# Patient Record
Sex: Female | Born: 1935 | Race: White | Hispanic: No | Smoking: Never smoker
Health system: Southern US, Community
[De-identification: ages and names within clinical notes are randomized; demographics above are authoritative.]

## PROBLEM LIST (undated history)

## (undated) DIAGNOSIS — I10 Essential (primary) hypertension: Secondary | ICD-10-CM

## (undated) DIAGNOSIS — K219 Gastro-esophageal reflux disease without esophagitis: Secondary | ICD-10-CM

## (undated) DIAGNOSIS — F329 Major depressive disorder, single episode, unspecified: Secondary | ICD-10-CM

## (undated) DIAGNOSIS — E785 Hyperlipidemia, unspecified: Secondary | ICD-10-CM

## (undated) DIAGNOSIS — G4700 Insomnia, unspecified: Secondary | ICD-10-CM

## (undated) DIAGNOSIS — K589 Irritable bowel syndrome without diarrhea: Secondary | ICD-10-CM

## (undated) DIAGNOSIS — F32A Depression, unspecified: Secondary | ICD-10-CM

## (undated) HISTORY — PX: TOE AMPUTATION: SHX809

---

## 2018-01-08 ENCOUNTER — Inpatient Hospital Stay (HOSPITAL_COMMUNITY): Payer: Medicare Other

## 2018-01-08 ENCOUNTER — Encounter (HOSPITAL_COMMUNITY)
Admission: EM | Disposition: A | Discharge status: Skilled Nursing Facility | Payer: Self-pay | Source: Home / Self Care | Attending: Internal Medicine

## 2018-01-08 ENCOUNTER — Encounter (HOSPITAL_COMMUNITY): Payer: Self-pay | Admitting: Internal Medicine

## 2018-01-08 ENCOUNTER — Inpatient Hospital Stay (HOSPITAL_COMMUNITY)
Admission: EM | Admit: 2018-01-08 | Discharge: 2018-01-11 | DRG: 481 | Disposition: A | Discharge status: Skilled Nursing Facility | Payer: Medicare Other | Attending: Family Medicine | Admitting: Family Medicine

## 2018-01-08 ENCOUNTER — Other Ambulatory Visit: Payer: Self-pay

## 2018-01-08 ENCOUNTER — Emergency Department (HOSPITAL_COMMUNITY): Payer: Medicare Other

## 2018-01-08 ENCOUNTER — Inpatient Hospital Stay (HOSPITAL_COMMUNITY): Payer: Medicare Other | Admitting: Anesthesiology

## 2018-01-08 DIAGNOSIS — J9811 Atelectasis: Secondary | ICD-10-CM | POA: Diagnosis present

## 2018-01-08 DIAGNOSIS — F05 Delirium due to known physiological condition: Secondary | ICD-10-CM | POA: Diagnosis not present

## 2018-01-08 DIAGNOSIS — K219 Gastro-esophageal reflux disease without esophagitis: Secondary | ICD-10-CM | POA: Diagnosis present

## 2018-01-08 DIAGNOSIS — Z880 Allergy status to penicillin: Secondary | ICD-10-CM | POA: Diagnosis not present

## 2018-01-08 DIAGNOSIS — G479 Sleep disorder, unspecified: Secondary | ICD-10-CM

## 2018-01-08 DIAGNOSIS — F5101 Primary insomnia: Secondary | ICD-10-CM

## 2018-01-08 DIAGNOSIS — S72001A Fracture of unspecified part of neck of right femur, initial encounter for closed fracture: Secondary | ICD-10-CM | POA: Diagnosis not present

## 2018-01-08 DIAGNOSIS — K582 Mixed irritable bowel syndrome: Secondary | ICD-10-CM | POA: Diagnosis not present

## 2018-01-08 DIAGNOSIS — K581 Irritable bowel syndrome with constipation: Secondary | ICD-10-CM | POA: Diagnosis present

## 2018-01-08 DIAGNOSIS — R410 Disorientation, unspecified: Secondary | ICD-10-CM | POA: Diagnosis not present

## 2018-01-08 DIAGNOSIS — S72141A Displaced intertrochanteric fracture of right femur, initial encounter for closed fracture: Principal | ICD-10-CM | POA: Diagnosis present

## 2018-01-08 DIAGNOSIS — N184 Chronic kidney disease, stage 4 (severe): Secondary | ICD-10-CM | POA: Diagnosis present

## 2018-01-08 DIAGNOSIS — E860 Dehydration: Secondary | ICD-10-CM

## 2018-01-08 DIAGNOSIS — D72829 Elevated white blood cell count, unspecified: Secondary | ICD-10-CM | POA: Diagnosis not present

## 2018-01-08 DIAGNOSIS — R300 Dysuria: Secondary | ICD-10-CM | POA: Diagnosis not present

## 2018-01-08 DIAGNOSIS — Z87440 Personal history of urinary (tract) infections: Secondary | ICD-10-CM

## 2018-01-08 DIAGNOSIS — Z66 Do not resuscitate: Secondary | ICD-10-CM | POA: Diagnosis present

## 2018-01-08 DIAGNOSIS — I10 Essential (primary) hypertension: Secondary | ICD-10-CM | POA: Diagnosis not present

## 2018-01-08 DIAGNOSIS — K589 Irritable bowel syndrome without diarrhea: Secondary | ICD-10-CM | POA: Diagnosis present

## 2018-01-08 DIAGNOSIS — E785 Hyperlipidemia, unspecified: Secondary | ICD-10-CM | POA: Diagnosis present

## 2018-01-08 DIAGNOSIS — G4700 Insomnia, unspecified: Secondary | ICD-10-CM | POA: Diagnosis present

## 2018-01-08 DIAGNOSIS — Z79899 Other long term (current) drug therapy: Secondary | ICD-10-CM

## 2018-01-08 DIAGNOSIS — S72009A Fracture of unspecified part of neck of unspecified femur, initial encounter for closed fracture: Secondary | ICD-10-CM | POA: Diagnosis present

## 2018-01-08 DIAGNOSIS — F32A Depression, unspecified: Secondary | ICD-10-CM | POA: Diagnosis present

## 2018-01-08 DIAGNOSIS — Y92099 Unspecified place in other non-institutional residence as the place of occurrence of the external cause: Secondary | ICD-10-CM

## 2018-01-08 DIAGNOSIS — Z882 Allergy status to sulfonamides status: Secondary | ICD-10-CM | POA: Diagnosis not present

## 2018-01-08 DIAGNOSIS — I129 Hypertensive chronic kidney disease with stage 1 through stage 4 chronic kidney disease, or unspecified chronic kidney disease: Secondary | ICD-10-CM | POA: Diagnosis present

## 2018-01-08 DIAGNOSIS — Z825 Family history of asthma and other chronic lower respiratory diseases: Secondary | ICD-10-CM | POA: Diagnosis not present

## 2018-01-08 DIAGNOSIS — F329 Major depressive disorder, single episode, unspecified: Secondary | ICD-10-CM | POA: Diagnosis present

## 2018-01-08 DIAGNOSIS — Z833 Family history of diabetes mellitus: Secondary | ICD-10-CM

## 2018-01-08 DIAGNOSIS — L89611 Pressure ulcer of right heel, stage 1: Secondary | ICD-10-CM | POA: Diagnosis not present

## 2018-01-08 DIAGNOSIS — M25551 Pain in right hip: Secondary | ICD-10-CM | POA: Diagnosis present

## 2018-01-08 DIAGNOSIS — D62 Acute posthemorrhagic anemia: Secondary | ICD-10-CM | POA: Diagnosis not present

## 2018-01-08 DIAGNOSIS — N179 Acute kidney failure, unspecified: Secondary | ICD-10-CM | POA: Diagnosis present

## 2018-01-08 DIAGNOSIS — Z419 Encounter for procedure for purposes other than remedying health state, unspecified: Secondary | ICD-10-CM

## 2018-01-08 DIAGNOSIS — G8918 Other acute postprocedural pain: Secondary | ICD-10-CM | POA: Diagnosis not present

## 2018-01-08 DIAGNOSIS — Z89429 Acquired absence of other toe(s), unspecified side: Secondary | ICD-10-CM | POA: Diagnosis not present

## 2018-01-08 DIAGNOSIS — I9589 Other hypotension: Secondary | ICD-10-CM | POA: Diagnosis present

## 2018-01-08 DIAGNOSIS — I959 Hypotension, unspecified: Secondary | ICD-10-CM | POA: Diagnosis not present

## 2018-01-08 DIAGNOSIS — W1839XA Other fall on same level, initial encounter: Secondary | ICD-10-CM | POA: Diagnosis present

## 2018-01-08 HISTORY — DX: Insomnia, unspecified: G47.00

## 2018-01-08 HISTORY — PX: INTRAMEDULLARY (IM) NAIL INTERTROCHANTERIC: SHX5875

## 2018-01-08 HISTORY — DX: Major depressive disorder, single episode, unspecified: F32.9

## 2018-01-08 HISTORY — DX: Essential (primary) hypertension: I10

## 2018-01-08 HISTORY — DX: Irritable bowel syndrome without diarrhea: K58.9

## 2018-01-08 HISTORY — DX: Gastro-esophageal reflux disease without esophagitis: K21.9

## 2018-01-08 HISTORY — DX: Depression, unspecified: F32.A

## 2018-01-08 HISTORY — DX: Hyperlipidemia, unspecified: E78.5

## 2018-01-08 LAB — CBC WITH DIFFERENTIAL/PLATELET
Abs Immature Granulocytes: 0.05 10*3/uL (ref 0.00–0.07)
BASOS ABS: 0.1 10*3/uL (ref 0.0–0.1)
Basophils Relative: 0 %
EOS ABS: 0.1 10*3/uL (ref 0.0–0.5)
Eosinophils Relative: 1 %
HCT: 40.8 % (ref 36.0–46.0)
Hemoglobin: 12.9 g/dL (ref 12.0–15.0)
Immature Granulocytes: 0 %
Lymphocytes Relative: 19 %
Lymphs Abs: 2.4 10*3/uL (ref 0.7–4.0)
MCH: 30.4 pg (ref 26.0–34.0)
MCHC: 31.6 g/dL (ref 30.0–36.0)
MCV: 96 fL (ref 80.0–100.0)
Monocytes Absolute: 0.6 10*3/uL (ref 0.1–1.0)
Monocytes Relative: 5 %
NRBC: 0 % (ref 0.0–0.2)
Neutro Abs: 9.2 10*3/uL — ABNORMAL HIGH (ref 1.7–7.7)
Neutrophils Relative %: 75 %
Platelets: 242 10*3/uL (ref 150–400)
RBC: 4.25 MIL/uL (ref 3.87–5.11)
RDW: 13.2 % (ref 11.5–15.5)
WBC: 12.3 10*3/uL — AB (ref 4.0–10.5)

## 2018-01-08 LAB — BASIC METABOLIC PANEL
Anion gap: 9 (ref 5–15)
BUN: 27 mg/dL — ABNORMAL HIGH (ref 8–23)
CO2: 18 mmol/L — ABNORMAL LOW (ref 22–32)
Calcium: 9.2 mg/dL (ref 8.9–10.3)
Chloride: 111 mmol/L (ref 98–111)
Creatinine, Ser: 1.63 mg/dL — ABNORMAL HIGH (ref 0.44–1.00)
GFR calc Af Amer: 31 mL/min — ABNORMAL LOW (ref >60)
GFR calc non Af Amer: 27 mL/min — ABNORMAL LOW (ref >60)
Glucose, Bld: 164 mg/dL — ABNORMAL HIGH (ref 70–99)
Potassium: 4.2 mmol/L (ref 3.5–5.1)
SODIUM: 138 mmol/L (ref 135–145)

## 2018-01-08 LAB — ABO/RH: ABO/RH(D): A POS

## 2018-01-08 LAB — PROTIME-INR
INR: 0.99
Prothrombin Time: 13 seconds (ref 11.4–15.2)

## 2018-01-08 SURGERY — FIXATION, FRACTURE, INTERTROCHANTERIC, WITH INTRAMEDULLARY ROD
Anesthesia: General | Site: Hip | Laterality: Right

## 2018-01-08 MED ORDER — CLINDAMYCIN PHOSPHATE 900 MG/50ML IV SOLN
INTRAVENOUS | Status: AC
  Filled 2018-01-08: qty 50

## 2018-01-08 MED ORDER — ACETAMINOPHEN 325 MG PO TABS
650.0000 mg | ORAL_TABLET | Freq: Four times a day (QID) | ORAL | Status: DC | PRN
  Administered 2018-01-10 (×2): 325 mg via ORAL
  Filled 2018-01-08: qty 2

## 2018-01-08 MED ORDER — FAMOTIDINE 20 MG PO TABS
20.0000 mg | ORAL_TABLET | Freq: Every day | ORAL | Status: DC
  Administered 2018-01-08 – 2018-01-10 (×3): 20 mg via ORAL
  Filled 2018-01-08 (×3): qty 1

## 2018-01-08 MED ORDER — LIDOCAINE 2% (20 MG/ML) 5 ML SYRINGE
INTRAMUSCULAR | Status: AC
  Filled 2018-01-08: qty 5

## 2018-01-08 MED ORDER — PROPOFOL 10 MG/ML IV BOLUS
INTRAVENOUS | Status: AC
  Filled 2018-01-08: qty 20

## 2018-01-08 MED ORDER — EPHEDRINE SULFATE 50 MG/ML IJ SOLN
INTRAMUSCULAR | Status: DC | PRN
  Administered 2018-01-08 (×5): 5 mg via INTRAVENOUS

## 2018-01-08 MED ORDER — OLANZAPINE 5 MG PO TABS
2.5000 mg | ORAL_TABLET | Freq: Every day | ORAL | Status: DC
  Administered 2018-01-08 – 2018-01-10 (×3): 2.5 mg via ORAL
  Filled 2018-01-08 (×3): qty 1

## 2018-01-08 MED ORDER — DIAZEPAM 2 MG PO TABS
4.0000 mg | ORAL_TABLET | Freq: Every evening | ORAL | Status: DC | PRN
  Administered 2018-01-08: 4 mg via ORAL
  Filled 2018-01-08: qty 2

## 2018-01-08 MED ORDER — FENTANYL CITRATE (PF) 100 MCG/2ML IJ SOLN
50.0000 ug | INTRAMUSCULAR | Status: DC | PRN
  Administered 2018-01-08 (×2): 50 ug via INTRAVENOUS
  Filled 2018-01-08: qty 2

## 2018-01-08 MED ORDER — PROPOFOL 10 MG/ML IV BOLUS
INTRAVENOUS | Status: DC | PRN
  Administered 2018-01-08: 70 mg via INTRAVENOUS

## 2018-01-08 MED ORDER — ONDANSETRON HCL 4 MG/2ML IJ SOLN
INTRAMUSCULAR | Status: AC
  Filled 2018-01-08: qty 2

## 2018-01-08 MED ORDER — ROCURONIUM BROMIDE 50 MG/5ML IV SOSY
PREFILLED_SYRINGE | INTRAVENOUS | Status: DC | PRN
  Administered 2018-01-08: 50 mg via INTRAVENOUS

## 2018-01-08 MED ORDER — CLINDAMYCIN PHOSPHATE 600 MG/50ML IV SOLN
600.0000 mg | Freq: Four times a day (QID) | INTRAVENOUS | Status: AC
  Administered 2018-01-08 – 2018-01-09 (×3): 600 mg via INTRAVENOUS
  Filled 2018-01-08 (×3): qty 50

## 2018-01-08 MED ORDER — ACETAMINOPHEN 10 MG/ML IV SOLN
INTRAVENOUS | Status: AC
  Filled 2018-01-08: qty 100

## 2018-01-08 MED ORDER — LACTATED RINGERS IV SOLN
INTRAVENOUS | Status: DC
  Administered 2018-01-08: 16:00:00 via INTRAVENOUS

## 2018-01-08 MED ORDER — DOCUSATE SODIUM 100 MG PO CAPS
100.0000 mg | ORAL_CAPSULE | Freq: Two times a day (BID) | ORAL | Status: DC
  Administered 2018-01-08 – 2018-01-10 (×5): 100 mg via ORAL
  Filled 2018-01-08 (×6): qty 1

## 2018-01-08 MED ORDER — ACETAMINOPHEN 325 MG PO TABS: 325.0000 mg | ORAL_TABLET | Freq: Four times a day (QID) | ORAL | Status: DC | PRN

## 2018-01-08 MED ORDER — ONDANSETRON HCL 4 MG/2ML IJ SOLN
INTRAMUSCULAR | Status: DC | PRN
  Administered 2018-01-08: 4 mg via INTRAVENOUS

## 2018-01-08 MED ORDER — DEXAMETHASONE SODIUM PHOSPHATE 10 MG/ML IJ SOLN
INTRAMUSCULAR | Status: DC | PRN
  Administered 2018-01-08: 10 mg via INTRAVENOUS

## 2018-01-08 MED ORDER — ACETAMINOPHEN 10 MG/ML IV SOLN
INTRAVENOUS | Status: DC | PRN
  Administered 2018-01-08: 1000 mg via INTRAVENOUS

## 2018-01-08 MED ORDER — ROCURONIUM BROMIDE 50 MG/5ML IV SOSY
PREFILLED_SYRINGE | INTRAVENOUS | Status: AC
  Filled 2018-01-08: qty 5

## 2018-01-08 MED ORDER — METOCLOPRAMIDE HCL 5 MG/ML IJ SOLN: 5.0000 mg | Freq: Three times a day (TID) | INTRAMUSCULAR | Status: DC | PRN

## 2018-01-08 MED ORDER — FENTANYL CITRATE (PF) 250 MCG/5ML IJ SOLN
INTRAMUSCULAR | Status: DC | PRN
  Administered 2018-01-08: 25 ug via INTRAVENOUS
  Administered 2018-01-08: 50 ug via INTRAVENOUS

## 2018-01-08 MED ORDER — METOCLOPRAMIDE HCL 5 MG PO TABS: 5.0000 mg | ORAL_TABLET | Freq: Three times a day (TID) | ORAL | Status: DC | PRN

## 2018-01-08 MED ORDER — ACETAMINOPHEN 650 MG RE SUPP: 650.0000 mg | Freq: Four times a day (QID) | RECTAL | Status: DC | PRN

## 2018-01-08 MED ORDER — 0.9 % SODIUM CHLORIDE (POUR BTL) OPTIME
TOPICAL | Status: DC | PRN
  Administered 2018-01-08: 1000 mL

## 2018-01-08 MED ORDER — SENNOSIDES-DOCUSATE SODIUM 8.6-50 MG PO TABS: 1.0000 | ORAL_TABLET | Freq: Every evening | ORAL | Status: DC | PRN

## 2018-01-08 MED ORDER — SODIUM CHLORIDE 0.9 % IV SOLN
INTRAVENOUS | Status: DC
  Administered 2018-01-08: 21:00:00 via INTRAVENOUS

## 2018-01-08 MED ORDER — DEXAMETHASONE SODIUM PHOSPHATE 10 MG/ML IJ SOLN
INTRAMUSCULAR | Status: AC
  Filled 2018-01-08: qty 1

## 2018-01-08 MED ORDER — HYDROCODONE-ACETAMINOPHEN 5-325 MG PO TABS
1.0000 | ORAL_TABLET | ORAL | Status: DC | PRN
  Administered 2018-01-09 – 2018-01-10 (×5): 1 via ORAL
  Filled 2018-01-08 (×5): qty 1

## 2018-01-08 MED ORDER — FENTANYL CITRATE (PF) 100 MCG/2ML IJ SOLN
12.5000 ug | INTRAMUSCULAR | Status: DC | PRN
  Administered 2018-01-08: 12.5 ug via INTRAVENOUS

## 2018-01-08 MED ORDER — ONDANSETRON HCL 4 MG/2ML IJ SOLN: 4.0000 mg | Freq: Four times a day (QID) | INTRAMUSCULAR | Status: DC | PRN

## 2018-01-08 MED ORDER — ONDANSETRON HCL 4 MG PO TABS: 4.0000 mg | ORAL_TABLET | Freq: Four times a day (QID) | ORAL | Status: DC | PRN

## 2018-01-08 MED ORDER — PANTOPRAZOLE SODIUM 40 MG PO TBEC
40.0000 mg | DELAYED_RELEASE_TABLET | Freq: Every day | ORAL | Status: DC
  Administered 2018-01-08 – 2018-01-10 (×3): 40 mg via ORAL
  Filled 2018-01-08 (×3): qty 1

## 2018-01-08 MED ORDER — ENOXAPARIN SODIUM 40 MG/0.4ML ~~LOC~~ SOLN
40.0000 mg | SUBCUTANEOUS | Status: DC
  Administered 2018-01-09 – 2018-01-10 (×2): 40 mg via SUBCUTANEOUS
  Filled 2018-01-08 (×2): qty 0.4

## 2018-01-08 MED ORDER — FENTANYL CITRATE (PF) 100 MCG/2ML IJ SOLN
INTRAMUSCULAR | Status: AC
  Administered 2018-01-08: 12.5 ug via INTRAVENOUS
  Filled 2018-01-08: qty 2

## 2018-01-08 MED ORDER — FENTANYL CITRATE (PF) 250 MCG/5ML IJ SOLN
INTRAMUSCULAR | Status: AC
  Filled 2018-01-08: qty 5

## 2018-01-08 MED ORDER — CLINDAMYCIN PHOSPHATE 900 MG/50ML IV SOLN
900.0000 mg | INTRAVENOUS | Status: AC
  Administered 2018-01-08: 900 mg via INTRAVENOUS

## 2018-01-08 MED ORDER — ACETAMINOPHEN 500 MG PO TABS
500.0000 mg | ORAL_TABLET | Freq: Four times a day (QID) | ORAL | Status: AC
  Administered 2018-01-08 – 2018-01-09 (×4): 500 mg via ORAL
  Filled 2018-01-08 (×3): qty 1

## 2018-01-08 MED ORDER — LIDOCAINE 2% (20 MG/ML) 5 ML SYRINGE
INTRAMUSCULAR | Status: DC | PRN
  Administered 2018-01-08: 50 mg via INTRAVENOUS

## 2018-01-08 MED ORDER — LACTATED RINGERS IV BOLUS
1000.0000 mL | Freq: Once | INTRAVENOUS | Status: AC
  Administered 2018-01-08: 1000 mL via INTRAVENOUS

## 2018-01-08 MED ORDER — SUGAMMADEX SODIUM 200 MG/2ML IV SOLN
INTRAVENOUS | Status: DC | PRN
  Administered 2018-01-08: 150 mg via INTRAVENOUS

## 2018-01-08 MED ORDER — FENTANYL CITRATE (PF) 100 MCG/2ML IJ SOLN: 25.0000 ug | INTRAMUSCULAR | Status: DC | PRN

## 2018-01-08 MED ORDER — OXYCODONE HCL 5 MG PO TABS
5.0000 mg | ORAL_TABLET | ORAL | Status: DC | PRN
  Administered 2018-01-10 – 2018-01-11 (×2): 5 mg via ORAL
  Filled 2018-01-08 (×2): qty 1

## 2018-01-08 MED ORDER — MORPHINE SULFATE (PF) 2 MG/ML IV SOLN: 0.5000 mg | INTRAVENOUS | Status: DC | PRN

## 2018-01-08 SURGICAL SUPPLY — 49 items
BIT DRILL SHORT 4.2 (BIT) ×1 IMPLANT
BLADE TFNA HELICAL 100 STRL (Orthopedic Implant) ×3 IMPLANT
CHLORAPREP W/TINT 26ML (MISCELLANEOUS) ×3 IMPLANT
COVER MAYO STAND STRL (DRAPES) IMPLANT
COVER PERINEAL POST (MISCELLANEOUS) ×3 IMPLANT
COVER SURGICAL LIGHT HANDLE (MISCELLANEOUS) ×3 IMPLANT
COVER WAND RF STERILE (DRAPES) ×3 IMPLANT
DRAPE C-ARM 42X72 X-RAY (DRAPES) ×3 IMPLANT
DRAPE C-ARMOR (DRAPES) ×3 IMPLANT
DRAPE HALF SHEET 40X57 (DRAPES) ×6 IMPLANT
DRAPE ORTHO SPLIT 77X108 STRL (DRAPES)
DRAPE STERI IOBAN 125X83 (DRAPES) ×3 IMPLANT
DRAPE SURG ORHT 6 SPLT 77X108 (DRAPES) IMPLANT
DRILL BIT SHORT 4.2 (BIT) ×2
DRSG ADAPTIC 3X8 NADH LF (GAUZE/BANDAGES/DRESSINGS) ×3 IMPLANT
DRSG MEPILEX BORDER 4X4 (GAUZE/BANDAGES/DRESSINGS) ×6 IMPLANT
DRSG MEPILEX BORDER 4X8 (GAUZE/BANDAGES/DRESSINGS) ×3 IMPLANT
DURAPREP 26ML APPLICATOR (WOUND CARE) IMPLANT
ELECT REM PT RETURN 9FT ADLT (ELECTROSURGICAL) ×3
ELECTRODE REM PT RTRN 9FT ADLT (ELECTROSURGICAL) ×1 IMPLANT
EVACUATOR 1/8 PVC DRAIN (DRAIN) IMPLANT
GLOVE BIOGEL M STRL SZ7.5 (GLOVE) ×3 IMPLANT
GLOVE BIOGEL PI IND STRL 8 (GLOVE) ×4 IMPLANT
GLOVE BIOGEL PI INDICATOR 8 (GLOVE) ×8
GLOVE ORTHO TXT STRL SZ7.5 (GLOVE) ×18 IMPLANT
GLOVE SURG ORTHO 8.0 STRL STRW (GLOVE) ×12 IMPLANT
GOWN STRL REUS W/ TWL LRG LVL3 (GOWN DISPOSABLE) ×2 IMPLANT
GOWN STRL REUS W/ TWL XL LVL3 (GOWN DISPOSABLE) ×3 IMPLANT
GOWN STRL REUS W/TWL LRG LVL3 (GOWN DISPOSABLE) ×4
GOWN STRL REUS W/TWL XL LVL3 (GOWN DISPOSABLE) ×6
GUIDEWIRE 3.2X400 (WIRE) ×3 IMPLANT
KIT BASIN OR (CUSTOM PROCEDURE TRAY) ×3 IMPLANT
KIT TURNOVER KIT B (KITS) ×3 IMPLANT
LINER BOOT UNIVERSAL DISP (MISCELLANEOUS) ×3 IMPLANT
MANIFOLD NEPTUNE II (INSTRUMENTS) ×3 IMPLANT
NAIL TROCH FIX LNG 11X380RT (Nail) ×3 IMPLANT
NS IRRIG 1000ML POUR BTL (IV SOLUTION) ×3 IMPLANT
PACK GENERAL/GYN (CUSTOM PROCEDURE TRAY) ×3 IMPLANT
PAD ARMBOARD 7.5X6 YLW CONV (MISCELLANEOUS) ×6 IMPLANT
REAMER ROD DEEP FLUTE 2.5X950 (INSTRUMENTS) ×3 IMPLANT
SCREW LOCKING 5.0X38MM (Screw) ×3 IMPLANT
STAPLER VISISTAT 35W (STAPLE) ×3 IMPLANT
SUT ETHILON 2 0 FS 18 (SUTURE) ×6 IMPLANT
SUT MNCRL AB 3-0 PS2 18 (SUTURE) ×3 IMPLANT
SUT PDS AB 2-0 CT1 27 (SUTURE) ×3 IMPLANT
SUT VIC AB 1 CTB1 27 (SUTURE) ×6 IMPLANT
SUT VIC AB 2-0 CT1 27 (SUTURE) ×4
SUT VIC AB 2-0 CT1 TAPERPNT 27 (SUTURE) ×2 IMPLANT
WATER STERILE IRR 1000ML POUR (IV SOLUTION) ×9 IMPLANT

## 2018-01-08 NOTE — ED Notes (Signed)
Consent form verified with ortho

## 2018-01-08 NOTE — Anesthesia Procedure Notes (Signed)
Procedure Name: Intubation Date/Time: 01/08/2018 4:15 PM Performed by: Wilburn Cornelia, CRNA Pre-anesthesia Checklist: Patient identified, Emergency Drugs available, Suction available, Timeout performed and Patient being monitored Patient Re-evaluated:Patient Re-evaluated prior to induction Oxygen Delivery Method: Circle system utilized Preoxygenation: Pre-oxygenation with 100% oxygen Induction Type: Cricoid Pressure applied, Rapid sequence and IV induction Ventilation: Mask ventilation without difficulty Laryngoscope Size: Mac and 3 Grade View: Grade II Tube type: Oral Tube size: 7.0 mm Number of attempts: 1 Airway Equipment and Method: Stylet Placement Confirmation: ETT inserted through vocal cords under direct vision,  positive ETCO2,  breath sounds checked- equal and bilateral and CO2 detector Secured at: 20 cm Tube secured with: Tape Dental Injury: Teeth and Oropharynx as per pre-operative assessment

## 2018-01-08 NOTE — ED Notes (Signed)
Patient transported to X-ray 

## 2018-01-08 NOTE — Transfer of Care (Signed)
Immediate Anesthesia Transfer of Care Note  Patient: Jenna Rogers  Procedure(s) Performed: IM FEMORAL NAIL- RIGHT (Right Hip)  Patient Location: PACU  Anesthesia Type:General  Level of Consciousness: awake  Airway & Oxygen Therapy: Patient Spontanous Breathing and Patient connected to nasal cannula oxygen  Post-op Assessment: Report given to RN and Post -op Vital signs reviewed and stable  Post vital signs: Reviewed and stable  Last Vitals:  Vitals Value Taken Time  BP 100/70 01/08/2018  6:00 PM  Temp    Pulse 58 01/08/2018  6:03 PM  Resp 13 01/08/2018  6:03 PM  SpO2 99 % 01/08/2018  6:03 PM  Vitals shown include unvalidated device data.  Last Pain:  Vitals:   01/08/18 1547  TempSrc:   PainSc: 3          Complications: No apparent anesthesia complications

## 2018-01-08 NOTE — ED Provider Notes (Signed)
Emergency Department Provider Note   I have reviewed the triage vital signs and the nursing notes.   HISTORY  Chief Complaint Leg Injury   HPI Jenna Rogers is a 84 y.o. female with no known past medical history the presents to the emergency department today after a fall and hip pain.  Patient states that she is going to sit on a chair and the chair was not there and she fell directly on her right hip with instant pain to that right upper leg.  No pain when staying still but he cannot range of motion or try to bear weight hurt significantly.  EMS was called given pain medication the way here which seemed to help some but every time she moves it hurts.  No pain anywhere else.  Did not hit her head.  No neck or back pain.  No left upper or lower extremity pain.  No right upper arm pain no chest pain.  No abdominal pain. No other associated or modifying symptoms.    Past Medical History:  Diagnosis Date  . Depression   . GERD (gastroesophageal reflux disease)   . HLD (hyperlipidemia)   . HTN (hypertension)   . IBS (irritable bowel syndrome)   . Insomnia     Patient Active Problem List   Diagnosis Date Noted  . Hip fracture (HCC) 01/08/2018  . Insomnia   . IBS (irritable bowel syndrome)   . GERD (gastroesophageal reflux disease)   . Depression         Allergies Penicillins  Family History  Problem Relation Age of Onset  . Asthma Mother   . Diabetes Mellitus II Father     Social History Social History   Tobacco Use  . Smoking status: Never Smoker  . Smokeless tobacco: Never Used  Substance Use Topics  . Alcohol use: Never    Frequency: Never  . Drug use: Never    Review of Systems  All other systems negative except as documented in the HPI. All pertinent positives and negatives as reviewed in the HPI. ____________________________________________   PHYSICAL EXAM:  VITAL SIGNS: ED Triage Vitals  Enc Vitals Group     BP 01/08/18 1115 128/75   Pulse Rate 01/08/18 1115 79     Resp 01/08/18 1115 19     Temp 01/08/18 1115 (!) 97.4 F (36.3 C)     Temp Source 01/08/18 1115 Oral     SpO2 01/08/18 1112 100 %    Constitutional: Alert and oriented. Well appearing and in no acute distress. Eyes: Conjunctivae are normal. PERRL. EOMI. Head: Atraumatic. Nose: No congestion/rhinnorhea. Mouth/Throat: Mucous membranes are dry.  Oropharynx non-erythematous. Neck: No stridor.  No meningeal signs.   Cardiovascular: Normal rate, regular rhythm. Good peripheral circulation. Grossly normal heart sounds.   Respiratory: Normal respiratory effort.  No retractions. Lungs CTAB. Gastrointestinal: Soft and nontender. No distention.  Musculoskeletal: Right hip ttp and pain with ROM. no edema. Right leg on a pillow but appears to be unnaturally externally rotated Neurologic:  Normal speech and language. No gross focal neurologic deficits are appreciated.  Skin:  Skin is warm, dry and intact. No rash noted.   ____________________________________________   LABS (all labs ordered are listed, but only abnormal results are displayed)  Labs Reviewed  BASIC METABOLIC PANEL - Abnormal; Notable for the following components:      Result Value   CO2 18 (*)    Glucose, Bld 164 (*)    BUN 27 (*)  Creatinine, Ser 1.63 (*)    GFR calc non Af Amer 27 (*)    GFR calc Af Amer 31 (*)    All other components within normal limits  CBC WITH DIFFERENTIAL/PLATELET - Abnormal; Notable for the following components:   WBC 12.3 (*)    Neutro Abs 9.2 (*)    All other components within normal limits  PROTIME-INR  BASIC METABOLIC PANEL  CBC  URINALYSIS, ROUTINE W REFLEX MICROSCOPIC  TYPE AND SCREEN  ABO/RH   ____________________________________________  EKG   EKG Interpretation  Date/Time:  Saturday January 08 2018 11:46:35 EST Ventricular Rate:  65 PR Interval:    QRS Duration: 85 QT Interval:  432 QTC Calculation: 450 R Axis:   -26 Text  Interpretation:  Sinus rhythm Borderline left axis deviation No old tracing to compare Confirmed by Marily MemosMesner, Cashawn Yanko [phone]([id number]) on 01/08/2018 12:45:12 PM       ____________________________________________  RADIOLOGY  Dg Chest 1 View  Result Date: 01/08/2018 CLINICAL DATA:  84 year old nursing home patient fell and sustained a RIGHT femoral neck fracture today. Preoperative evaluation. EXAM: CHEST  1 VIEW COMPARISON:  None. FINDINGS: Cardiac silhouette normal in size. Thoracic aorta atherosclerotic. Hilar and mediastinal contours otherwise unremarkable. Streaky and patchy airspace opacities involving the LEFT base with silhouetting of the LEFT hemidiaphragm. Minimal linear atelectasis involving the RIGHT lung base. Lungs otherwise clear. Normal pulmonary vascularity. IMPRESSION: 1. Atelectasis and/or bronchopneumonia involving the LEFT lower lobe. 2. Minimal linear atelectasis involving the RIGHT lung base. Electronically Signed   By: Hulan Saashomas  Lawrence M.D.   On: 01/08/2018 12:43   Dg C-arm 1-60 Min  Result Date: 01/08/2018 CLINICAL DATA:  Status post surgery for fixation of the right femur. EXAM: RIGHT FEMUR 2 VIEWS; DG C-ARM 61-120 MIN COMPARISON:  None. FINDINGS: Four fluoroscopic images demonstrate interval insertion at a right femoral rod through fracture of the intertrochanteric region right femur. No gross malalignment is identified. IMPRESSION: Status post fixation of the right femur is described. Electronically Signed   By: Sherian ReinWei-Chen  Lin M.D.   On: 01/08/2018 22:31   Dg Hip Unilat W Or Wo Pelvis 2-3 Views Right  Result Date: 01/08/2018 CLINICAL DATA:  84 year old and recent fall. Right upper thigh pain. Evaluate for fracture. EXAM: DG HIP (WITH OR WITHOUT PELVIS) 2-3V RIGHT COMPARISON:  None. FINDINGS: There is a displaced fracture involving the right femoral intertrochanteric region best seen on the cross-table lateral view. The lesser trochanter is displaced medially and superiorly. The  right femoral head is located. Pelvic bony ring is intact. IMPRESSION: Right femoral intertrochanteric fracture. Electronically Signed   By: Richarda OverlieAdam  Henn M.D.   On: 01/08/2018 12:46   Dg Femur, Min 2 Views Right  Result Date: 01/08/2018 CLINICAL DATA:  Status post surgery for fixation of the right femur. EXAM: RIGHT FEMUR 2 VIEWS; DG C-ARM 61-120 MIN COMPARISON:  None. FINDINGS: Four fluoroscopic images demonstrate interval insertion at a right femoral rod through fracture of the intertrochanteric region right femur. No gross malalignment is identified. IMPRESSION: Status post fixation of the right femur is described. Electronically Signed   By: Sherian ReinWei-Chen  Lin M.D.   On: 01/08/2018 22:31   Dg Femur, Min 2 Views Right  Result Date: 01/08/2018 CLINICAL DATA:  Right hip fracture EXAM: RIGHT FEMUR 2 VIEWS COMPARISON:  Same day pelvis and right hip radiographs FINDINGS: The proximal right femur are included as part of the pelvis and right hip study. Mid to distal right femur and knee joint are nonacute. Minimal soft tissue  calcification overlying the quadriceps tendon may reflect a small phlebolith. Mild degenerative joint space narrowing of the femorotibial compartment. The AP view is slightly oblique limiting further assessment. IMPRESSION: No acute osseous abnormality of the mid to distal right femur. Please refer to the right hip radiographs for an intertrochanteric fracture seen of the right femur. Electronically Signed   By: Tollie Eth M.D.   On: 01/08/2018 14:12    ____________________________________________   PROCEDURES  Procedure(s) performed:   Procedures   ____________________________________________   INITIAL IMPRESSION / ASSESSMENT AND PLAN / ED COURSE  eval for hip/pelvis fracture. Pain control.   R hip fracture. AKI. Will d/w oncall ortho and medicine. pcp nevill gates.  D/w dr. Criselda Peaches, will admit.  Pertinent labs & imaging results that were available during my care of the  patient were reviewed by me and considered in my medical decision making (see chart for details).  ____________________________________________  FINAL CLINICAL IMPRESSION(S) / ED DIAGNOSES  Final diagnoses:  Closed fracture of right hip, initial encounter (HCC)  AKI (acute kidney injury) (HCC)  Dehydration     MEDICATIONS GIVEN DURING THIS VISIT:  Medications  fentaNYL (SUBLIMAZE) injection 50 mcg ( Intravenous MAR Unhold 01/08/18 1836)  0.9 %  sodium chloride infusion ( Intravenous Paused 01/09/18 0458)  acetaminophen (TYLENOL) tablet 650 mg (has no administration in time range)    Or  acetaminophen (TYLENOL) suppository 650 mg (has no administration in time range)  oxyCODONE (Oxy IR/ROXICODONE) immediate release tablet 5 mg (has no administration in time range)  senna-docusate (Senokot-S) tablet 1 tablet (has no administration in time range)  ondansetron (ZOFRAN) tablet 4 mg (has no administration in time range)    Or  ondansetron (ZOFRAN) injection 4 mg (has no administration in time range)  diazepam (VALIUM) tablet 4 mg (4 mg Oral Given 01/08/18 2030)  OLANZapine (ZYPREXA) tablet 2.5 mg (2.5 mg Oral Given 01/08/18 2023)  pantoprazole (PROTONIX) EC tablet 40 mg (40 mg Oral Given 01/08/18 1900)  famotidine (PEPCID) tablet 20 mg (20 mg Oral Given 01/08/18 2022)  fentaNYL (SUBLIMAZE) injection 12.5 mcg (12.5 mcg Intravenous Given 01/08/18 1538)  docusate sodium (COLACE) capsule 100 mg (100 mg Oral Given 01/08/18 2021)  metoCLOPramide (REGLAN) tablet 5-10 mg (has no administration in time range)    Or  metoCLOPramide (REGLAN) injection 5-10 mg (has no administration in time range)  enoxaparin (LOVENOX) injection 40 mg (has no administration in time range)  HYDROcodone-acetaminophen (NORCO/VICODIN) 5-325 MG per tablet 1-2 tablet (has no administration in time range)  morphine 2 MG/ML injection 0.5-1 mg (has no administration in time range)  acetaminophen (TYLENOL) tablet 500 mg  (500 mg Oral Given 01/09/18 0546)  sodium chloride 0.9 % bolus 500 mL (500 mLs Intravenous New Bag/Given 01/09/18 0647)  lactated ringers bolus 1,000 mL (0 mLs Intravenous Stopped 01/08/18 1400)  clindamycin (CLEOCIN) IVPB 900 mg (900 mg Intravenous Given 01/08/18 1618)  clindamycin (CLEOCIN) 900 MG/50ML IVPB (  Override pull for Anesthesia 01/08/18 1618)  clindamycin (CLEOCIN) IVPB 600 mg (600 mg Intravenous New Bag/Given 01/09/18 0458)     NEW OUTPATIENT MEDICATIONS STARTED DURING THIS VISIT:  There are no discharge medications for this patient.   Note:  This note was prepared with assistance of Dragon voice recognition software. Occasional wrong-word or sound-a-like substitutions may have occurred due to the inherent limitations of voice recognition software.   Corleone Biegler, Barbara Cower, MD 01/09/18 [phone]

## 2018-01-08 NOTE — Anesthesia Preprocedure Evaluation (Addendum)
Anesthesia Evaluation  Patient identified by MRN, date of birth, ID band Patient awake    Reviewed: Allergy & Precautions, NPO status , Patient's Chart, lab work & pertinent test results  Airway Mallampati: II  TM Distance: >3 FB Neck ROM: Full    Dental  (+) Teeth Intact, Dental Advisory Given   Pulmonary    breath sounds clear to auscultation       Cardiovascular hypertension,  Rhythm:Regular Rate:Normal     Neuro/Psych Depression    GI/Hepatic Neg liver ROS, GERD  ,  Endo/Other  negative endocrine ROS  Renal/GU negative Renal ROS     Musculoskeletal negative musculoskeletal ROS (+)   Abdominal Normal abdominal exam  (+)   Peds  Hematology negative hematology ROS (+)   Anesthesia Other Findings - HLD  Reproductive/Obstetrics                           Anesthesia Physical Anesthesia Plan  ASA: III  Anesthesia Plan: General   Post-op Pain Management:    Induction: Intravenous  PONV Risk Score and Plan: 4 or greater and Ondansetron and Treatment may vary due to age or medical condition  Airway Management Planned: Oral ETT  Additional Equipment: None  Intra-op Plan:   Post-operative Plan: Extubation in OR  Informed Consent: I have reviewed the patients History and Physical, chart, labs and discussed the procedure including the risks, benefits and alternatives for the proposed anesthesia with the patient or authorized representative who has indicated his/her understanding and acceptance.   Dental advisory given  Plan Discussed with: CRNA  Anesthesia Plan Comments:        Anesthesia Quick Evaluation

## 2018-01-08 NOTE — ED Triage Notes (Signed)
[facility] after missing the chair where she meant to sit down and falling. Pt has some outward rotation of right leg and possible shortening per EMS. Pt c/o right upper thigh pain and sts she is unable to move it, although EMS reports normal movement after pain meds given. 100 Fentanyl given PTA.

## 2018-01-08 NOTE — Progress Notes (Signed)
RN verified the presence of a signed informed consent that matches stated procedure by patient. Verified armband matches patient's stated name and birth date. Verified NPO status (0900 today, Dr. Hollis is awarHart Rochestere) and that all jewelry, contact, glasses, dentures, and partials had been removed (if applicable). Patient has rings on both hands that I cannot remove. Dr. Hart RochesterHollis is aware of same.

## 2018-01-08 NOTE — Consult Note (Signed)
Reason for Consult: Right intertrochanteric femur fracture [facility]  Jenna Rogers is an 84 y.o. female.  HPI: Patient was brought to the emergency department after falling while trying to sit down missing a chair.  X-rays on arrival revealed a right intertrochanteric femur fracture.  Orthopedics was consulted for evaluation.  On my evaluation patient complains of pain in the right hip region.  Also in the proximal thigh.  It is severe and sharp in quality.  She denies any numbness or tingling in the right lower extremity.  She denies any other joint or extremity pain today.  Of note patient ambulates at baseline with a walker.  She lives in an assisted living facility.  She has had prior kyphoplasty procedure for lumbar compression fracture.  Past Medical History:  Diagnosis Date  . Depression   . GERD (gastroesophageal reflux disease)   . HLD (hyperlipidemia)   . HTN (hypertension)   . IBS (irritable bowel syndrome)   . Insomnia     Past Surgical History:  Procedure Laterality Date  . TOE AMPUTATION      Family History  Problem Relation Age of Onset  . Asthma Mother   . Diabetes Mellitus II Father     Social History:  reports that she has never smoked. She has never used smokeless tobacco. She reports that she does not drink alcohol or use drugs.  Allergies:  Allergies  Allergen Reactions  . Penicillins     Medications: I have reviewed the patient's current medications.  Results for orders placed or performed during the hospital encounter of 01/08/18 (from the past 48 hour(s))  Basic metabolic panel     Status: Abnormal   Collection Time: 01/08/18 11:53 AM  Result Value Ref Range   Sodium 138 135 - 145 mmol/L   Potassium 4.2 3.5 - 5.1 mmol/L   Chloride 111 98 - 111 mmol/L   CO2 18 (L) 22 - 32 mmol/L   Glucose, Bld 164 (H) 70 - 99 mg/dL   BUN 27 (H) 8 - 23 mg/dL   Creatinine, Ser 1.611.63 (H) 0.44 - 1.00 mg/dL   Calcium 9.2  8.9 - 09.610.3 mg/dL   GFR calc non Af Amer 27 (L) >60 mL/min   GFR calc Af Amer 31 (L) >60 mL/min   Anion gap 9 5 - 15    Comment: Performed at [facility], [address] [place], [place] [address]  CBC WITH DIFFERENTIAL     Status: Abnormal   Collection Time: 01/08/18 11:53 AM  Result Value Ref Range   WBC 12.3 (H) 4.0 - 10.5 K/uL   RBC 4.25 3.87 - 5.11 MIL/uL   Hemoglobin 12.9 12.0 - 15.0 g/dL   HCT 98.140.8 19.136.0 - 47.846.0 %   MCV 96.0 80.0 - 100.0 fL   MCH 30.4 26.0 - 34.0 pg   MCHC 31.6 30.0 - 36.0 g/dL   RDW 29.513.2 62.111.5 - 30.815.5 %   Platelets 242 150 - 400 K/uL   nRBC 0.0 0.0 - 0.2 %   Neutrophils Relative % 75 %   Neutro Abs 9.2 (H) 1.7 - 7.7 K/uL   Lymphocytes Relative 19 %   Lymphs Abs 2.4 0.7 - 4.0 K/uL   Monocytes Relative 5 %   Monocytes Absolute 0.6 0.1 - 1.0 K/uL   Eosinophils Relative 1 %   Eosinophils Absolute 0.1 0.0 - 0.5 K/uL   Basophils Relative 0 %   Basophils Absolute 0.1 0.0 - 0.1  K/uL   Immature Granulocytes 0 %   Abs Immature Granulocytes 0.05 0.00 - 0.07 K/uL    Comment: Performed at [facility], [address] [place], [place] [address]  Protime-INR     Status: None   Collection Time: 01/08/18 11:53 AM  Result Value Ref Range   Prothrombin Time 13.0 11.4 - 15.2 seconds   INR 0.99     Comment: Performed at [facility], [address] [place], [place] [address]  Type and screen [facility]     Status: None   Collection Time: 01/08/18 11:53 AM  Result Value Ref Range   ABO/RH(D) A POS    Antibody Screen NEG    Sample Expiration      01/11/2018 Performed at [facility], [address] [place], [place] [address]   ABO/Rh     Status: None   Collection Time: 01/08/18 11:53 AM  Result Value Ref Range   ABO/RH(D)      A POS Performed at [facility], [address] [place], [place] [address]     Dg Chest 1 View  Result Date: 01/08/2018 CLINICAL DATA:  84 year old nursing home patient fell and  sustained a RIGHT femoral neck fracture today. Preoperative evaluation. EXAM: CHEST  1 VIEW COMPARISON:  None. FINDINGS: Cardiac silhouette normal in size. Thoracic aorta atherosclerotic. Hilar and mediastinal contours otherwise unremarkable. Streaky and patchy airspace opacities involving the LEFT base with silhouetting of the LEFT hemidiaphragm. Minimal linear atelectasis involving the RIGHT lung base. Lungs otherwise clear. Normal pulmonary vascularity. IMPRESSION: 1. Atelectasis and/or bronchopneumonia involving the LEFT lower lobe. 2. Minimal linear atelectasis involving the RIGHT lung base. Electronically Signed   By: Hulan Saashomas  Lawrence M.D.   On: 01/08/2018 12:43   Dg Hip Unilat W Or Wo Pelvis 2-3 Views Right  Result Date: 01/08/2018 CLINICAL DATA:  84 year old and recent fall. Right upper thigh pain. Evaluate for fracture. EXAM: DG HIP (WITH OR WITHOUT PELVIS) 2-3V RIGHT COMPARISON:  None. FINDINGS: There is a displaced fracture involving the right femoral intertrochanteric region best seen on the cross-table lateral view. The lesser trochanter is displaced medially and superiorly. The right femoral head is located. Pelvic bony ring is intact. IMPRESSION: Right femoral intertrochanteric fracture. Electronically Signed   By: Richarda OverlieAdam  Henn M.D.   On: 01/08/2018 12:46   Dg Femur, Min 2 Views Right  Result Date: 01/08/2018 CLINICAL DATA:  Right hip fracture EXAM: RIGHT FEMUR 2 VIEWS COMPARISON:  Same day pelvis and right hip radiographs FINDINGS: The proximal right femur are included as part of the pelvis and right hip study. Mid to distal right femur and knee joint are nonacute. Minimal soft tissue calcification overlying the quadriceps tendon may reflect a small phlebolith. Mild degenerative joint space narrowing of the femorotibial compartment. The AP view is slightly oblique limiting further assessment. IMPRESSION: No acute osseous abnormality of the mid to distal right femur. Please refer to the right  hip radiographs for an intertrochanteric fracture seen of the right femur. Electronically Signed   By: Tollie Ethavid  Kwon M.D.   On: 01/08/2018 14:12    Review of Systems  Constitutional: Negative.   HENT: Negative.   Respiratory: Negative.   Cardiovascular: Negative.   Gastrointestinal: Negative.   Musculoskeletal:       Right hip pain  Skin: Negative.   Neurological: Negative.   Psychiatric/Behavioral: Negative.    Blood pressure (!) 132/52, pulse 64, temperature (!) 97.4 F (36.3 C),  temperature source Oral, resp. rate 14, SpO2 95 %. Physical Exam  Constitutional: She appears well-developed.  HENT:  Head: Normocephalic.  Eyes: Conjunctivae are normal.  Cardiovascular: Normal rate.  Respiratory: Effort normal.  GI: Soft.  Musculoskeletal:     Comments: Patient has shortening and external rotation of the right lower extremity.  She has tenderness palpation at the hip.  She has no tenderness about the knee.  No deformed about the knee leg or ankle.  She is able to dorsiflex plantarflex the ankle actively.  Endorses sensation light touch of the right foot.  Foot is warm and well-perfused.  No evidence of left lower extremity injury.  No pain with left lower extremity logroll or palpation to the thigh, knee, leg, ankle or foot.  Foot is warm and well-perfused.  Actively dorsiflex and plantarflex the ankle.  Patient is able to raise bilateral upper extremities off the bed without difficulty.  No sign of traumatic injury bilaterally.  Endorses sensation intact in the median, ulnar, radial nerve distributions.  Neurological: She is alert.  Skin: Skin is warm.  Psychiatric: She has a normal mood and affect.    Assessment/Plan: Patient has a displaced right intertrochanteric femur fracture.  Given her baseline ambulatory status she is indicated for cephalo-medullary nailing.  She has been seen and evaluated by the hospitalist team and cleared for surgery.  She is n.p.o.  I discussed the  surgical procedure in detail as well as the risk, benefits and alternatives of the surgery with the patient and her daughter.  The risk discussed included but were not limited to wound healing complications, infection, nonunion, malunion, need for second surgery, damage to surrounding structures, less than optimal outcome and continued pain, we also discussed the perioperative and briefly the anesthetic risks which include death.  After weighing these risks they opted to proceed with surgery.  Terance Hart 01/08/2018, 3:45 PM

## 2018-01-08 NOTE — Consult Note (Signed)
Brief consult note: Full orthopedic consult note to follow  84 year old female status post fall with right intertrochanteric femur fracture.  I have reviewed the x-rays and spoken with the family as well as the emergency department attending Dr. Clayborne DanaMesner.  We will plan for cephalo-medullary nailing of her fracture once she is evaluated by the hospitalist team and deemed safe to proceed with surgery.  Keep n.p.o. Bedrest for now Pain control per emergency department

## 2018-01-08 NOTE — Op Note (Signed)
Jenna Rogers Jenna Rogers female 84 y.o. 01/08/2018  PreOperative Diagnosis: Right displaced intertrochanteric femur fracture  PostOperative Diagnosis: Same  Procedure(s) and Anesthesia Type: Cephalo-medullary nailing of right intertrochanteric femur fracture Intraoperative use of arthroscopy with interpretation of images.  Surgeon: Terance Harthristopher R Loring Liskey   Assistants: none  Anesthesia: General endotracheal anesthesia  Findings: Displaced right intertrochanteric femur fracture  Implants: Synthes cephalo-medullary nail 11 x 380  Indications:84 y.o. female had a fall at her assisted nursing facility that resulted in a right intertrochanteric femur fracture.  She is brought to the emergency department where x-rays revealed this.  She is having significant pain in the right hip.  She denied any other joint or extremity pain.  After reviewing the x-rays she was indicated for cephalo-medullary nailing of her intertrochanteric femur fracture.  Had a long discussion with the patient and the family about risk, benefits alternatives of surgery.  Risks included but were not limited to wound healing complications, infection, nonunion, malunion, continued pain, need for second surgery, damage to surrounding structures as well as perioperative and anesthetic risks which include death.  After weighing these risks and benefits she opted to proceed with surgery.  Of note she was a baseline ambulator with a walker.  Procedure Detail:  Patient was identified in the preoperative holding area.  The right hip was marked in the cell.  The consent was signed myself and the patient's family due to sedation.  She was taken to the operative suite and general endotracheal anesthesia was induced without difficulty.  She was moved over to the [place] table without difficulty.  She was placed in the boots and the left lower extremity was well-padded and secured to the table.  Gentle traction was performed to reduce her fracture.   Then Rogers-arm fluoroscopy was used to confirm reduction in the AP and lateral planes.  Some gentle internal rotation and traction resulted in acceptable reduction.  Then the right lower extremity was prepped and draped in the usual sterile fashion.  A surgical timeout was performed.  I began by percutaneously placing the guidewire at the tip of the greater trochanter and confirmed appropriate placement on AP and lateral fluoroscopy.  This was then entered into the greater trochanter down to the level of the lesser trochanter.  Then a skin incision was performed about the site of the guidepin.  Then the opening reamer was entered using a soft tissue protector.  This was taken down to the level of the lesser.  This was removed.  Reduction was maintained throughout this procedure.  Then a bead tip guidewire was placed down to the level of the knee.  This was measured and a 380 nail was chosen.  Then we sequentially reamed up to 12.5 and there was chatter and an 11 x 380 nail was chosen.  Then the nail was placed but upon entry the fracture site went into a bit of valgus.  Due to this a separate incision was made at the level of the medial calcar laterally and a bone hook was placed on the medial calcar to adequately reduce and take the femur back into an acceptable femoral neck angle.  Then the blade insertion guide was attached and a skin incision was made to allow this to be placed down to bone.  Then the cephalo-medullary guidepin was placed up the neck of the femur into a slightly inferior and slightly posterior position in the femoral head.  Appropriate length of the guidepin was confirmed.  Then using the guidepin  reamer and a soft tissue protector this was reamed.  Then the blade was placed and appropriate position and maintenance of reduction was confirmed.  Then the compression device was used to compress across the fracture.  This maintained adequate reduction.  Then the set pin was tightened and loosened to  half turn to allow for continued compression.  Then perfect circles were obtained at the knee and a distal interlocking screw was placed.  Throughout the case multiple Rogers-arm images were taken in the AP and lateral planes and I used these images to interpret for fracture reduction, placement of the nail and adequacy of fixation.  Again final x-rays were obtained and the reduction and position of the nail was found to be acceptable.  The wounds were irrigated copiously with normal saline and the deep tissue was closed with 2-0 PDS suture.  The superficial subcutaneous tissue was closed with 3-0 Monocryl and the skin with 2-0 nylon.  Soft dressings were placed.  All counts were correct at the end of the case She was moved to the hospital bed and awakened from anesthesia without difficulty.  She was taken to the recovery room in stable condition.  Post Op Instructions: Weightbearing as tolerated right lower extremity Lovenox for DVT prophylaxis while in house then will switch to 325 mg aspirin on discharge She will follow-up in 2 weeks for x-rays of the right femur and suture removal She will work with physical therapy while in house for assessment of appropriate disposition. She will be admitted to the hospitalist service for medical management.  Tourniquette Time:none  Estimated Blood Loss: 150 cc         Drains: none  Blood Given: none         Specimens: none       Complications:  * No complications entered in OR log *         Disposition: PACU - hemodynamically stable.         Condition: stable

## 2018-01-08 NOTE — H&P (Addendum)
History and Physical    Jenna Rogers ZOX:[id number] DOB: 09/07/1922 DOA: 01/08/2018  PCP: Johnella MoloneyNeville Gates Patient coming from: [facility]F [facility]  Chief Complaint: Fall, hip pain  HPI: Jenna Rogers is a 84 y.o. female with medical history significant of Depression, IBS, Insomnia, HLD, HTN, GERD who presents after a fall at her ALF and severe hip pain.  She reports going to the refrigerator for an ensure and coming to sit on a high backed chair.  She missed the chair and landed on her bottom and developed immediate right hip pain.  She called for help and was brought to the ED.  She is having chills in the ED, but is otherwise previously in her normal state of health today.  She denies dizziness, loss of consciousness, fever, chills, recent illness.  Her daughter, who is with her, reports that she does not like water and is normally somewhat dehydrated.  Denies cough, dysuria, increased urinary frequency.   ED Course: In the ED, she was noted to have a mildly low temperature, BP ranging from the 120s - 140s systolic. Her labwork showed an elevated Cr to 1.63, review of care everywhere shows she ranges from 1.3 - 1.4 at baseline.  She also has an elevated WBC.  She denies any urinary symptoms.   Review of Systems: As per HPI otherwise 10 point review of systems negative.   Past Medical History:  Diagnosis Date  . Depression   . GERD (gastroesophageal reflux disease)   . HLD (hyperlipidemia)   . HTN (hypertension)   . IBS (irritable bowel syndrome)   . Insomnia     Past Surgical History:  Procedure Laterality Date  . TOE AMPUTATION       reports that she has never smoked. She has never used smokeless tobacco. She reports that she does not drink alcohol or use drugs.  Allergies  Allergen Reactions  . Penicillins     Family History  Problem Relation Age of Onset  . Asthma Mother   . Diabetes Mellitus II Father    Pantoprazole 40mg  daily Famotidine daily Olanzapine 2.5mg   QHS Diazepam 4mg  Qhs PRN senna and immodium for IBS        Physical Exam:  Constitutional: Thin elderly woman, lying in bed Vitals:   01/08/18 1115 01/08/18 1130 01/08/18 1230 01/08/18 1245  BP: 128/75 (!) 131/56 (!) 143/79 (!) 132/52  Pulse: 79 77 73 64  Resp: 19 16 17 14   Temp: (!) 97.4 F (36.3 C)     TempSrc: Oral     SpO2: 97% 96% 93% 95%   Eyes: lids and conjunctivae normal, she has sunken eyes, this is apparently normal per daughter ENMT: Mucous membranes are dry. Discoloration of teeth Neck: normal, supple Respiratory: clear to auscultation anteriorly, patient could not roll due to hip fracture.  No wheezing or rales Cardiovascular: RR, NR, no murmur noted Abdomen: +BS, NT, ND Musculoskeletal: no clubbing / cyanosis. She has decreased skin turgor, she has an externally rotated and shortened RLE.  She has good pulses in the DP and she is able to move her toes bilaterally.  Skin: no rashes, lesions on exposed skin.  Thin skin, some bruising.  Neurologic: CN 2-12 grossly intact. Strength is grossly intact, she is not moving right lower extremity due to pain.  She has good sensation and can wiggle toes on that side Psychiatric: Normal judgment and insight. Alert and oriented x 3. Normal mood.    Labs on Admission: I have personally  reviewed following labs and imaging studies  CBC: Recent Labs  Lab 01/08/18 1153  WBC 12.3*  NEUTROABS 9.2*  HGB 12.9  HCT 40.8  MCV 96.0  PLT 242   Basic Metabolic Panel: Recent Labs  Lab 01/08/18 1153  NA 138  K 4.2  CL 111  CO2 18*  GLUCOSE 164*  BUN 27*  CREATININE 1.63*  CALCIUM 9.2   GFR: CrCl cannot be calculated (Unknown ideal weight.). Liver Function Tests: No results for input(s): AST, ALT, ALKPHOS, BILITOT, PROT, ALBUMIN in the last 168 hours. No results for input(s): LIPASE, AMYLASE in the last 168 hours. No results for input(s): AMMONIA in the last 168 hours. Coagulation Profile: Recent Labs  Lab  01/08/18 1153  INR 0.99   Cardiac Enzymes: No results for input(s): CKTOTAL, CKMB, CKMBINDEX, TROPONINI in the last 168 hours. BNP (last 3 results) No results for input(s): PROBNP in the last 8760 hours. HbA1C: No results for input(s): HGBA1C in the last 72 hours. CBG: No results for input(s): GLUCAP in the last 168 hours. Lipid Profile: No results for input(s): CHOL, HDL, LDLCALC, TRIG, CHOLHDL, LDLDIRECT in the last 72 hours. Thyroid Function Tests: No results for input(s): TSH, T4TOTAL, FREET4, T3FREE, THYROIDAB in the last 72 hours. Anemia Panel: No results for input(s): VITAMINB12, FOLATE, FERRITIN, TIBC, IRON, RETICCTPCT in the last 72 hours. Urine analysis: No results found for: COLORURINE, APPEARANCEUR, LABSPEC, PHURINE, GLUCOSEU, HGBUR, BILIRUBINUR, KETONESUR, PROTEINUR, UROBILINOGEN, NITRITE, LEUKOCYTESUR  Radiological Exams on Admission: Dg Chest 1 View  Result Date: 01/08/2018 CLINICAL DATA:  84 year old nursing home patient fell and sustained a RIGHT femoral neck fracture today. Preoperative evaluation. EXAM: CHEST  1 VIEW COMPARISON:  None. FINDINGS: Cardiac silhouette normal in size. Thoracic aorta atherosclerotic. Hilar and mediastinal contours otherwise unremarkable. Streaky and patchy airspace opacities involving the LEFT base with silhouetting of the LEFT hemidiaphragm. Minimal linear atelectasis involving the RIGHT lung base. Lungs otherwise clear. Normal pulmonary vascularity. IMPRESSION: 1. Atelectasis and/or bronchopneumonia involving the LEFT lower lobe. 2. Minimal linear atelectasis involving the RIGHT lung base. Electronically Signed   By: Hulan Saashomas  Lawrence M.D.   On: 01/08/2018 12:43   Dg Hip Unilat W Or Wo Pelvis 2-3 Views Right  Result Date: 01/08/2018 CLINICAL DATA:  84 year old and recent fall. Right upper thigh pain. Evaluate for fracture. EXAM: DG HIP (WITH OR WITHOUT PELVIS) 2-3V RIGHT COMPARISON:  None. FINDINGS: There is a displaced fracture involving  the right femoral intertrochanteric region best seen on the cross-table lateral view. The lesser trochanter is displaced medially and superiorly. The right femoral head is located. Pelvic bony ring is intact. IMPRESSION: Right femoral intertrochanteric fracture. Electronically Signed   By: Richarda OverlieAdam  Henn M.D.   On: 01/08/2018 12:46   Dg Femur, Min 2 Views Right  Result Date: 01/08/2018 CLINICAL DATA:  Right hip fracture EXAM: RIGHT FEMUR 2 VIEWS COMPARISON:  Same day pelvis and right hip radiographs FINDINGS: The proximal right femur are included as part of the pelvis and right hip study. Mid to distal right femur and knee joint are nonacute. Minimal soft tissue calcification overlying the quadriceps tendon may reflect a small phlebolith. Mild degenerative joint space narrowing of the femorotibial compartment. The AP view is slightly oblique limiting further assessment. IMPRESSION: No acute osseous abnormality of the mid to distal right femur. Please refer to the right hip radiographs for an intertrochanteric fracture seen of the right femur. Electronically Signed   By: Tollie Ethavid  Kwon M.D.   On: 01/08/2018 14:12  EKG: Independently reviewed. NSR, no ST or TW changes  Assessment/Plan  Hip fracture - Fall appears mechanical, she has no symptoms of acute infection or syncope.  She does have an elevated WBC and increased Cr which could be related to acute injury.  Will check UA for completion.  CXR did show possible early pneumonia, but she is asymptomatic.  Monitor for vital sign derangement.  - IVF with NS at 100cc/hr for 10 hours and trend renal function - RCRI is 0, giving her a low risk for cardiac event during surgery, She has no lung disease and no history of issues with anesthesia - Pain control with tylenol, oxycodone, morphine - hold NSAIDs given renal function - Orthopedics consulted and plan for surgery today.     Insomnia - Continue home diazepam 4mg  qhs    IBS (irritable bowel syndrome) -  PRN Senna.  If she develops diarrhea, consider adding PRN immodium    GERD (gastroesophageal reflux disease) - Continue home protonix and famotidine    Depression - Continue home olanzapine  She has a history of HTN and HLD, but is currently not treated for either.  Monitor vitals.  If BP acutely elevates, consider treating PRN.   DVT prophylaxis: SCDs, lovenox after surgery, discuss with orthopedics  Code Status: DNR, confirmed with daughter  Family Communication: Daughter at bedside  Disposition Plan: Admit for surgery  Consults called: Orthopedics, Dr. Susa Simmonds  Admission status: Med Surg, inpatient.    Debe Coder MD [facility] Pager [phone]  If 7PM-7AM, please contact night-coverage [email or web address] Password [facility]  01/08/2018, 2:33 PM

## 2018-01-09 DIAGNOSIS — N184 Chronic kidney disease, stage 4 (severe): Secondary | ICD-10-CM

## 2018-01-09 DIAGNOSIS — I959 Hypotension, unspecified: Secondary | ICD-10-CM

## 2018-01-09 LAB — BASIC METABOLIC PANEL
Anion gap: 10 (ref 5–15)
BUN: 27 mg/dL — ABNORMAL HIGH (ref 8–23)
CO2: 19 mmol/L — ABNORMAL LOW (ref 22–32)
Calcium: 8 mg/dL — ABNORMAL LOW (ref 8.9–10.3)
Chloride: 109 mmol/L (ref 98–111)
Creatinine, Ser: 1.71 mg/dL — ABNORMAL HIGH (ref 0.44–1.00)
GFR calc Af Amer: 29 mL/min — ABNORMAL LOW (ref >60)
GFR, EST NON AFRICAN AMERICAN: 25 mL/min — AB (ref >60)
Glucose, Bld: 183 mg/dL — ABNORMAL HIGH (ref 70–99)
Potassium: 4.8 mmol/L (ref 3.5–5.1)
Sodium: 138 mmol/L (ref 135–145)

## 2018-01-09 MED ORDER — POLYETHYLENE GLYCOL 3350 17 G PO PACK
17.0000 g | PACK | Freq: Every day | ORAL | Status: DC
  Administered 2018-01-09 – 2018-01-10 (×2): 17 g via ORAL
  Filled 2018-01-09 (×3): qty 1

## 2018-01-09 MED ORDER — SODIUM CHLORIDE 0.9 % IV BOLUS
500.0000 mL | Freq: Once | INTRAVENOUS | Status: AC
  Administered 2018-01-09: 500 mL via INTRAVENOUS

## 2018-01-09 MED ORDER — SENNOSIDES-DOCUSATE SODIUM 8.6-50 MG PO TABS
1.0000 | ORAL_TABLET | Freq: Every day | ORAL | Status: DC
  Administered 2018-01-09 – 2018-01-10 (×2): 1 via ORAL
  Filled 2018-01-09 (×2): qty 1

## 2018-01-09 MED ORDER — ALUM & MAG HYDROXIDE-SIMETH 200-200-20 MG/5ML PO SUSP
15.0000 mL | Freq: Four times a day (QID) | ORAL | Status: DC | PRN
  Administered 2018-01-11: 15 mL via ORAL
  Filled 2018-01-09: qty 30

## 2018-01-09 NOTE — Progress Notes (Addendum)
Subjective: 1 Day Post-Op Procedure(s) (LRB): IM FEMORAL NAIL- RIGHT (Right)  Patient is doing well this morning.  Pain is tolerable when she holds still.  She denies any numbness or tingling.  Denies any shortness of breath or chest pain.  She states she continues to have the shakes.  Objective: Vital signs in last 24 hours: Temp:  [97.4 F (36.3 C)-97.7 F (36.5 C)] 97.5 F (36.4 C) (12/29 0430) Pulse Rate:  [57-79] 66 (12/29 0430) Resp:  [12-22] 14 (12/28 1845) BP: (88-143)/(33-79) 88/55 (12/29 0430) SpO2:  [93 %-100 %] 98 % (12/29 0430)  Intake/Output from previous day: 12/28 0701 - 12/29 0700 In: 2000 [I.V.:700; IV Piggyback:1000] Out: 250 [Urine:200; Blood:50] Intake/Output this shift: No intake/output data recorded.  Recent Labs    01/08/18 1153 01/09/18 0611  HGB 12.9 8.9*   Recent Labs    01/08/18 1153 01/09/18 0611  WBC 12.3* 10.6*  RBC 4.25 2.84*  HCT 40.8 27.0*  PLT 242 196   Recent Labs    01/08/18 1153 01/09/18 0611  NA 138 138  K 4.2 4.8  CL 111 109  CO2 18* 19*  BUN 27* 27*  CREATININE 1.63* 1.71*  GLUCOSE 164* 183*  CALCIUM 9.2 8.0*   Recent Labs    01/08/18 1153  INR 0.99   Awake and alert Respirations even and unlabored No distress Right lower extremity in appropriate resting position.  No notable drainage of the dressing.  No pain with calf squeeze.  Able to dorsiflex plantarflex the ankle.  Endorses silt about the dorsal plantar surface of the foot.  Foot is warm and well-perfused  Assessment/Plan: 1 Day Post-Op Procedure(s) (LRB): IM FEMORAL NAIL- RIGHT (Right)  Talbert ForestShirley is doing very well today.  We will mobilize her with physical therapy.  Her hemoglobin this morning is 8.9.  She is a bit hypotensive but this may be her baseline.  She responded slightly to a 500 mL bolus overnight.  She will likely need SNF placement. Continue Lovenox for DVT prophylaxis Transition to 325 mg aspirin on discharge Continue pain  control.    Terance HartChristopher R Janie Strothman 01/09/2018, 9:09 AM

## 2018-01-09 NOTE — Progress Notes (Signed)
Patient remains up in the recliner chair.  Patient has a purewick in place and encouraged to urinate as needed.  The patient denies feeling full.  Bladder scan done for reassurance- 200 mls noted on scan.  Awaiting for the patient trial of urination.

## 2018-01-09 NOTE — Progress Notes (Signed)
The patient felt like she needed [facility] have [facility]  The patient needed much assistance.  Family members are present.  Medicated for pain.

## 2018-01-09 NOTE — Progress Notes (Signed)
[facility] PROGRESS NOTE  Malka SoShirley C Harkless ZOX:[id number] DOB: 03/15/1922 DOA: 01/08/2018  PCP: System, Provider Not In  Brief History/Interval Summary: 84 y.o. female with medical history significant of Depression, IBS, Insomnia, HLD, HTN, GERD who presented after a fall at her ALF and severe hip pain.  This was a mechanical fall.  She was found to have right hip fracture.  She was admitted to the hospital.  Reason for Visit: Right hip fracture  Consultants: Orthopedics  Procedures: ORIF right hip 12/28  Antibiotics: None  Subjective/Interval History: Patient states that her right hip hurts a lot when she moves around.  Denies any chest pain.  Complains of some indigestion.  Complains of drooling saliva.  Seems to be somewhat distracted.  Her daughter and son are at the bedside.  Per family patient always has had low blood pressures.  ROS: Denies any chest pain or shortness of breath.  Denies any dizziness.  Objective:  Vital Signs  Vitals:   01/08/18 1936 01/09/18 0027 01/09/18 0430 01/09/18 1057  BP: (!) 92/50 (!) 90/51 (!) 88/55 (!) 101/56  Pulse: 60 62 66 70  Resp:    18  Temp: (!) 97.5 F (36.4 C) (!) 97.5 F (36.4 C) (!) 97.5 F (36.4 C) 97.6 F (36.4 C)  TempSrc: Oral Axillary Axillary Oral  SpO2: 100% 95% 98% 96%    Intake/Output Summary (Last 24 hours) at 01/09/2018 1105 Last data filed at 01/08/2018 1825 Gross per 24 hour  Intake 2000 ml  Output 250 ml  Net 1750 ml   There were no vitals filed for this visit.  General appearance: alert, cooperative, appears stated age, distracted and no distress Head: Normocephalic, without obvious abnormality, atraumatic Resp: clear to auscultation bilaterally Cardio: regular rate and rhythm, S1, S2 normal, no murmur, click, rub or gallop GI: soft, non-tender; bowel sounds normal; no masses,  no organomegaly Extremities: Bruising noted over the right lateral thigh.  Able to move her feet Pulses: 2+ and  symmetric Neurologic: No focal neurological deficits.  Seems to be somewhat distracted.  Lab Results:  Data Reviewed: I have personally reviewed following labs and imaging studies  CBC: Recent Labs  Lab 01/08/18 1153 01/09/18 0611  WBC 12.3* 10.6*  NEUTROABS 9.2*  --   HGB 12.9 8.9*  HCT 40.8 27.0*  MCV 96.0 95.1  PLT 242 196    Basic Metabolic Panel: Recent Labs  Lab 01/08/18 1153 01/09/18 0611  NA 138 138  K 4.2 4.8  CL 111 109  CO2 18* 19*  GLUCOSE 164* 183*  BUN 27* 27*  CREATININE 1.63* 1.71*  CALCIUM 9.2 8.0*    GFR: CrCl cannot be calculated (Unknown ideal weight.).   Coagulation Profile: Recent Labs  Lab 01/08/18 1153  INR 0.99      Radiology Studies: Dg Chest 1 View  Result Date: 01/08/2018 CLINICAL DATA:  84 year old nursing home patient fell and sustained a RIGHT femoral neck fracture today. Preoperative evaluation. EXAM: CHEST  1 VIEW COMPARISON:  None. FINDINGS: Cardiac silhouette normal in size. Thoracic aorta atherosclerotic. Hilar and mediastinal contours otherwise unremarkable. Streaky and patchy airspace opacities involving the LEFT base with silhouetting of the LEFT hemidiaphragm. Minimal linear atelectasis involving the RIGHT lung base. Lungs otherwise clear. Normal pulmonary vascularity. IMPRESSION: 1. Atelectasis and/or bronchopneumonia involving the LEFT lower lobe. 2. Minimal linear atelectasis involving the RIGHT lung base. Electronically Signed   By: Hulan Saashomas  Lawrence M.D.   On: 01/08/2018 12:43   Dg C-arm 1-60 Min  Result Date: 01/08/2018 CLINICAL DATA:  Status post surgery for fixation of the right femur. EXAM: RIGHT FEMUR 2 VIEWS; DG C-ARM 61-120 MIN COMPARISON:  None. FINDINGS: Four fluoroscopic images demonstrate interval insertion at a right femoral rod through fracture of the intertrochanteric region right femur. No gross malalignment is identified. IMPRESSION: Status post fixation of the right femur is described. Electronically  Signed   By: Sherian ReinWei-Chen  Lin M.D.   On: 01/08/2018 22:31   Dg Hip Unilat W Or Wo Pelvis 2-3 Views Right  Result Date: 01/08/2018 CLINICAL DATA:  84 year old and recent fall. Right upper thigh pain. Evaluate for fracture. EXAM: DG HIP (WITH OR WITHOUT PELVIS) 2-3V RIGHT COMPARISON:  None. FINDINGS: There is a displaced fracture involving the right femoral intertrochanteric region best seen on the cross-table lateral view. The lesser trochanter is displaced medially and superiorly. The right femoral head is located. Pelvic bony ring is intact. IMPRESSION: Right femoral intertrochanteric fracture. Electronically Signed   By: Richarda OverlieAdam  Henn M.D.   On: 01/08/2018 12:46   Dg Femur, Min 2 Views Right  Result Date: 01/08/2018 CLINICAL DATA:  Status post surgery for fixation of the right femur. EXAM: RIGHT FEMUR 2 VIEWS; DG C-ARM 61-120 MIN COMPARISON:  None. FINDINGS: Four fluoroscopic images demonstrate interval insertion at a right femoral rod through fracture of the intertrochanteric region right femur. No gross malalignment is identified. IMPRESSION: Status post fixation of the right femur is described. Electronically Signed   By: Sherian ReinWei-Chen  Lin M.D.   On: 01/08/2018 22:31   Dg Femur, Min 2 Views Right  Result Date: 01/08/2018 CLINICAL DATA:  Right hip fracture EXAM: RIGHT FEMUR 2 VIEWS COMPARISON:  Same day pelvis and right hip radiographs FINDINGS: The proximal right femur are included as part of the pelvis and right hip study. Mid to distal right femur and knee joint are nonacute. Minimal soft tissue calcification overlying the quadriceps tendon may reflect a small phlebolith. Mild degenerative joint space narrowing of the femorotibial compartment. The AP view is slightly oblique limiting further assessment. IMPRESSION: No acute osseous abnormality of the mid to distal right femur. Please refer to the right hip radiographs for an intertrochanteric fracture seen of the right femur. Electronically Signed   By:  Tollie Ethavid  Kwon M.D.   On: 01/08/2018 14:12     Medications:  Scheduled: . acetaminophen  500 mg Oral Q6H  . docusate sodium  100 mg Oral BID  . enoxaparin (LOVENOX) injection  40 mg Subcutaneous Q24H  . famotidine  20 mg Oral QHS  . OLANZapine  2.5 mg Oral QHS  . pantoprazole  40 mg Oral Daily  . polyethylene glycol  17 g Oral Daily  . senna-docusate  1 tablet Oral QHS   Continuous:  ZOX:[id number]:acetaminophen **OR** acetaminophen, alum & mag hydroxide-simeth, diazepam, fentaNYL (SUBLIMAZE) injection, fentaNYL (SUBLIMAZE) injection, HYDROcodone-acetaminophen, metoCLOPramide **OR** metoCLOPramide (REGLAN) injection, morphine injection, ondansetron **OR** ondansetron (ZOFRAN) IV, oxyCODONE    Assessment/Plan:  Right hip fracture This is a result of a mechanical fall.  Patient underwent surgery yesterday.  Seems to be stable this morning.  Pain control.  Bowel regimen.  PT and OT evaluation.  Will likely need rehab at a skilled nursing facility.  Currently on Lovenox for DVT prophylaxis.  Orthopedic surgeon recommends transitioning to aspirin 325 mg at discharge.  Hypotension This is chronic per patient and her family.  She is asymptomatic.  Monitor periodically.  Abnormal chest x-ray X-ray raised concern for atelectasis versus pneumonia.  Patient tells me that she  has been getting over a chest cold for the past few weeks.  She denies any expectoration.  No shortness of breath.  She is afebrile.  At this time we will hold off on antibiotics.  Incentive spirometry.  Chronic kidney disease stage IV Old labs available in care everywhere.  Creatinine was 1.35 in April.  Creatinine here is 1.7 this morning.  Slightly higher than her baseline.  Avoid nephrotoxic agents.  Encourage oral intake.  Recheck tomorrow morning.    Acute blood loss anemia, due to operative loss Drop in hemoglobin noted most likely due to surgery.  No overt bleeding noted.  Recheck tomorrow.  Insomnia Continue  diazepam  Irritable bowel syndrome Laxatives.  History of GERD Continue PPI and famotidine.  Maalox as needed.  History of depression Olanzapine  DVT Prophylaxis: On Lovenox    Code Status: DNR Family Communication: Discussed with the patient's daughter Disposition Plan: Management as outlined above.    LOS: 1 day   Osvaldo Shipper  [facility] Pager [phone] 01/09/2018, 11:05 AM  If 7PM-7AM, please contact night-coverage at [email or web address], password [facility]

## 2018-01-09 NOTE — Progress Notes (Signed)
In and out catheter done.  300 mls dark yellow urine obtained.  The patient reports getting relief from "Abd discomfort"  The patient remains in the recliner at the bedside.  Family members are present at this time

## 2018-01-09 NOTE — Progress Notes (Signed)
Pt has been disoriented to place, time and situation the entire evening. She has called 911 several times claiming she is being held prisoner. She insists she is not at the hospital and she has been rude and accusatory towards the staff when trying to help her. She refused to use the purewick, so she was helped to the bedside commode and voided approx. 200cc. She became very angry after getting up to void.

## 2018-01-09 NOTE — Progress Notes (Signed)
Patient's family called out for assist with IV.   Reported that the patient's IV site is bleeding.  Patient has thin aging skin, IV site is bruised and small amount of oozing from the site.  IV transparent dressing was changed and paper tape was used to secure.

## 2018-01-09 NOTE — Evaluation (Signed)
Physical Therapy Evaluation Patient Details Name: Jenna Rogers MRN: [id number] DOB: 10/30/1922 Today's Date: 01/09/2018   History of Present Illness  84 y.o. female with medical history significant of Depression, IBS, Insomnia, HLD, HTN, GERD who presented after a fall at her ALF and severe hip pain. s/p ORIF R hip 12/28  Clinical Impression   Patient is s/p above surgery resulting in functional limitations due to the deficits listed below (see PT Problem List). Walked independently with cane prior to admission; Presents to PT with decr functional mobility, decr activity tolerance; Unable to obtain BP in standing, but I posit the lightheadedness in standing was likely due to orthostatic BP drop in standing;  Patient will benefit from skilled PT to increase their independence and safety with mobility to allow discharge to the venue listed below.       Follow Up Recommendations SNF    Equipment Recommendations  Rolling walker with 5" wheels;3in1 (PT)([employer]-sized)    Recommendations for Other Services       Precautions / Restrictions Precautions Precautions: Fall Precaution Comments: Watch Orthostatics Restrictions RLE Weight Bearing: Weight bearing as tolerated      Mobility  Bed Mobility                  Transfers Overall transfer level: Needs assistance Equipment used: Rolling walker (2 wheeled) Transfers: Sit to/from Stand Sit to Stand: Mod assist;+2 safety/equipment         General transfer comment: Light mod assist to power up to stand and steady while pt moved hands to RW  Ambulation/Gait Ambulation/Gait assistance: +2 safety/equipment;Mod assist Gait Distance (Feet): 3 Feet(approx 6 steps) Assistive device: Rolling walker (2 wheeled) Gait Pattern/deviations: Step-to pattern;Antalgic;Trunk flexed     General Gait Details: Cues for sequence and to bear down through RW to unweigh painful R hip in stance; became quite dizzy with upright activity and we  stopped amb trial early because of that  Stairs            Wheelchair Mobility    Modified Rankin (Stroke Patients Only)       Balance Overall balance assessment: Needs assistance   Sitting balance-Leahy Scale: Fair       Standing balance-Leahy Scale: Poor                               Pertinent Vitals/Pain Pain Assessment: Faces Faces Pain Scale: Hurts even more Pain Location: R hip with weight bearing and mobiltiy Pain Descriptors / Indicators: Aching;Grimacing;Guarding Pain Intervention(s): Monitored during session    Home Living Family/patient expects to be discharged to:: Skilled nursing facility                      Prior Function Level of Independence: Independent with assistive device(s)               Hand Dominance        Extremity/Trunk Assessment   Upper Extremity Assessment Upper Extremity Assessment: Generalized weakness    Lower Extremity Assessment Lower Extremity Assessment: RLE deficits/detail RLE Deficits / Details: Grossly decr AROM and strength, limited by pain postop RLE: Unable to fully assess due to pain       Communication   Communication: HOH  Cognition Arousal/Alertness: Awake/alert Behavior During Therapy: WFL for tasks assessed/performed Overall Cognitive Status: Within Functional Limits for tasks assessed  General Comments General comments (skin integrity, edema, etc.): Attempted, but unable to obtain a standing BP; BP near supine in reclienr after bout with dizziness 106/43, HR 81    Exercises     Assessment/Plan    PT Assessment Patient needs continued PT services  PT Problem List Decreased strength;Decreased range of motion;Decreased activity tolerance;Decreased balance;Decreased mobility;Decreased knowledge of use of DME;Decreased knowledge of precautions;Cardiopulmonary status limiting activity;Pain       PT Treatment  Interventions DME instruction;Gait training;Functional mobility training;Therapeutic activities;Therapeutic exercise;Balance training;Patient/family education    PT Goals (Current goals can be found in the Care Plan section)  Acute Rehab PT Goals Patient Stated Goal: Did not specifically state, but agreeable to try PT PT Goal Formulation: With patient Time For Goal Achievement: 05-31-2018 Potential to Achieve Goals: Good    Frequency Min 2X/week   Barriers to discharge        Co-evaluation               AM-PAC PT "6 Clicks" Mobility  Outcome Measure Help needed turning from your back to your side while in a flat bed without using bedrails?: A Lot Help needed moving from lying on your back to sitting on the side of a flat bed without using bedrails?: A Lot Help needed moving to and from a bed to a chair (including a wheelchair)?: A Lot Help needed standing up from a chair using your arms (e.g., wheelchair or bedside chair)?: A Lot Help needed to walk in hospital room?: A Lot Help needed climbing 3-5 steps with a railing? : Total 6 Click Score: 11    End of Session Equipment Utilized During Treatment: Gait belt Activity Tolerance: Other (comment)(Limited by lightheadedness in standing) Patient left: in chair;with call bell/phone within reach;with family/visitor present Nurse Communication: Mobility status PT Visit Diagnosis: Unsteadiness on feet (R26.81);Other abnormalities of gait and mobility (R26.89);Difficulty in walking, not elsewhere classified (R26.2);Pain Pain - Right/Left: Right Pain - part of body: Hip    Time: 1101-1140 PT Time Calculation (min) (ACUTE ONLY): 39 min   Charges:   PT Evaluation $PT Eval Moderate Complexity: 1 Mod PT Treatments $Gait Training: 8-22 mins $Therapeutic Activity: 8-22 mins        Van ClinesHolly Tarig Zimmers, PT  Acute Rehabilitation Services Pager [phone] Office [phone]   Levi AlandHolly H Colm Lyford 01/09/2018, 3:12 PM

## 2018-01-09 NOTE — Anesthesia Postprocedure Evaluation (Signed)
Anesthesia Post Note  Patient: Jenna Rogers  Procedure(s) Performed: IM FEMORAL NAIL- RIGHT (Right Hip)     Patient location during evaluation: PACU Anesthesia Type: General Level of consciousness: awake and alert Pain management: pain level controlled Vital Signs Assessment: post-procedure vital signs reviewed and stable Respiratory status: spontaneous breathing, nonlabored ventilation, respiratory function stable and patient connected to nasal cannula oxygen Cardiovascular status: blood pressure returned to baseline and stable Postop Assessment: no apparent nausea or vomiting Anesthetic complications: no    Last Vitals:  Vitals:   01/08/18 1845 01/08/18 1936  BP: (!) 95/42 (!) 92/50  Pulse: (!) 57 60  Resp: 14   Temp: (!) 36.3 C (!) 36.4 C  SpO2: 97% 100%    Last Pain:  Vitals:   01/08/18 1936  TempSrc: Oral  PainSc:                  Shelton SilvasKevin D Yen Wandell

## 2018-01-09 NOTE — Progress Notes (Signed)
Notified on call physician that patient's blood pressure was 88/55 , bolus ordered

## 2018-01-10 ENCOUNTER — Encounter (HOSPITAL_COMMUNITY): Payer: Self-pay | Admitting: Orthopaedic Surgery

## 2018-01-10 DIAGNOSIS — D72829 Elevated white blood cell count, unspecified: Secondary | ICD-10-CM

## 2018-01-10 DIAGNOSIS — G8918 Other acute postprocedural pain: Secondary | ICD-10-CM

## 2018-01-10 DIAGNOSIS — G479 Sleep disorder, unspecified: Secondary | ICD-10-CM

## 2018-01-10 DIAGNOSIS — I10 Essential (primary) hypertension: Secondary | ICD-10-CM

## 2018-01-10 DIAGNOSIS — R300 Dysuria: Secondary | ICD-10-CM

## 2018-01-10 DIAGNOSIS — D62 Acute posthemorrhagic anemia: Secondary | ICD-10-CM

## 2018-01-10 LAB — BASIC METABOLIC PANEL
Anion gap: 7 (ref 5–15)
BUN: 27 mg/dL — ABNORMAL HIGH (ref 8–23)
CO2: 20 mmol/L — ABNORMAL LOW (ref 22–32)
CREATININE: 1.54 mg/dL — AB (ref 0.44–1.00)
Calcium: 8.1 mg/dL — ABNORMAL LOW (ref 8.9–10.3)
Chloride: 111 mmol/L (ref 98–111)
GFR calc Af Amer: 33 mL/min — ABNORMAL LOW (ref >60)
GFR calc non Af Amer: 29 mL/min — ABNORMAL LOW (ref >60)
Glucose, Bld: 103 mg/dL — ABNORMAL HIGH (ref 70–99)
Potassium: 4.5 mmol/L (ref 3.5–5.1)
SODIUM: 138 mmol/L (ref 135–145)

## 2018-01-10 LAB — CBC
HCT: 27 % — ABNORMAL LOW (ref 36.0–46.0)
HCT: 22.4 % — ABNORMAL LOW (ref 36.0–46.0)
HEMOGLOBIN: 8.9 g/dL — AB (ref 12.0–15.0)
Hemoglobin: 7.4 g/dL — ABNORMAL LOW (ref 12.0–15.0)
MCH: 31.3 pg (ref 26.0–34.0)
MCH: 31.5 pg (ref 26.0–34.0)
MCHC: 33 g/dL (ref 30.0–36.0)
MCHC: 33 g/dL (ref 30.0–36.0)
MCV: 95.1 fL (ref 80.0–100.0)
MCV: 95.3 fL (ref 80.0–100.0)
Platelets: 196 10*3/uL (ref 150–400)
Platelets: 202 10*3/uL (ref 150–400)
RBC: 2.84 MIL/uL — ABNORMAL LOW (ref 3.87–5.11)
RBC: 2.35 MIL/uL — ABNORMAL LOW (ref 3.87–5.11)
RDW: 13.2 % (ref 11.5–15.5)
RDW: 13.7 % (ref 11.5–15.5)
WBC: 10.6 10*3/uL — ABNORMAL HIGH (ref 4.0–10.5)
WBC: 13.5 10*3/uL — ABNORMAL HIGH (ref 4.0–10.5)
nRBC: 0 % (ref 0.0–0.2)
nRBC: 0 % (ref 0.0–0.2)

## 2018-01-10 LAB — URINALYSIS, ROUTINE W REFLEX MICROSCOPIC
BILIRUBIN URINE: NEGATIVE
Bilirubin Urine: NEGATIVE
Glucose, UA: NEGATIVE mg/dL
Glucose, UA: NEGATIVE mg/dL
Hgb urine dipstick: NEGATIVE
Hgb urine dipstick: NEGATIVE
Ketones, ur: NEGATIVE mg/dL
Ketones, ur: NEGATIVE mg/dL
Leukocytes, UA: NEGATIVE
Nitrite: NEGATIVE
Nitrite: NEGATIVE
Protein, ur: NEGATIVE mg/dL
Protein, ur: NEGATIVE mg/dL
Specific Gravity, Urine: 1.014 (ref 1.005–1.030)
Specific Gravity, Urine: 1.017 (ref 1.005–1.030)
pH: 5 (ref 5.0–8.0)
pH: 5 (ref 5.0–8.0)

## 2018-01-10 LAB — PREPARE RBC (CROSSMATCH)

## 2018-01-10 LAB — HEMOGLOBIN AND HEMATOCRIT, BLOOD
HCT: 29.5 % — ABNORMAL LOW (ref 36.0–46.0)
HEMOGLOBIN: 9.4 g/dL — AB (ref 12.0–15.0)

## 2018-01-10 MED ORDER — HALOPERIDOL LACTATE 5 MG/ML IJ SOLN: 2.0000 mg | Freq: Four times a day (QID) | INTRAMUSCULAR | Status: DC | PRN

## 2018-01-10 MED ORDER — DIAZEPAM 2 MG PO TABS
2.0000 mg | ORAL_TABLET | Freq: Every day | ORAL | Status: DC
  Administered 2018-01-10: 2 mg via ORAL
  Filled 2018-01-10: qty 1

## 2018-01-10 MED ORDER — ENOXAPARIN SODIUM 30 MG/0.3ML ~~LOC~~ SOLN
30.0000 mg | SUBCUTANEOUS | Status: DC
  Filled 2018-01-10: qty 0.3

## 2018-01-10 MED ORDER — SODIUM CHLORIDE 0.9% IV SOLUTION
Freq: Once | INTRAVENOUS | Status: AC
  Administered 2018-01-10: 13:00:00 via INTRAVENOUS

## 2018-01-10 MED ORDER — ADULT MULTIVITAMIN W/MINERALS CH
1.0000 | ORAL_TABLET | Freq: Every day | ORAL | Status: DC
  Filled 2018-01-10 (×2): qty 1

## 2018-01-10 MED ORDER — THERA PO TABS: 1.0000 | ORAL_TABLET | ORAL | Status: DC

## 2018-01-10 MED ORDER — HALOPERIDOL LACTATE 5 MG/ML IJ SOLN
0.5000 mg | Freq: Once | INTRAMUSCULAR | Status: AC
  Administered 2018-01-10: 0.5 mg via INTRAVENOUS
  Filled 2018-01-10: qty 1

## 2018-01-10 MED ORDER — BISACODYL 10 MG RE SUPP
10.0000 mg | Freq: Every day | RECTAL | Status: DC | PRN
  Filled 2018-01-10: qty 1

## 2018-01-10 MED ORDER — VITAMIN D3 50 MCG (2000 UT) PO TABS: 2000.0000 [IU] | ORAL_TABLET | ORAL | Status: DC

## 2018-01-10 MED ORDER — HALOPERIDOL LACTATE 5 MG/ML IJ SOLN: 0.5000 mg | Freq: Four times a day (QID) | INTRAMUSCULAR | Status: DC | PRN

## 2018-01-10 MED ORDER — VITAMIN D 25 MCG (1000 UNIT) PO TABS
2000.0000 [IU] | ORAL_TABLET | Freq: Every day | ORAL | Status: DC
  Administered 2018-01-10: 2000 [IU] via ORAL
  Filled 2018-01-10 (×2): qty 2

## 2018-01-10 NOTE — Consult Note (Signed)
Physical Medicine and Rehabilitation Consult Reason for Consult:  Decreased functional mobility Referring Physician: [facility]   HPI: Jenna Rogers is a 84 y.o.right handed female with history of hypertension, irritable bowel syndrome, insomnia.  History taken from chart review and patient.  Patient is a resident of [facility] assisted [facility] facility. She used a walker prior to admission. Her son-in-law Dr. [name] is a local orthopedic surgeon. Presented 01/08/2018 after a fall without loss of consciousness sustaining a right displaced intertrochanteric femur fracture. Underwent medullary nailing 01/08/2018 per Dr.Adair. hospital course pain management. Weightbearing as tolerated. Close monitoring for any orthostatic changes. Acute blood loss anemia 7.4 and transfusion ongoing. Subcutaneous Lovenox for DVT prophylaxis.therapy evaluations completed. M.D. and family have requested physical medicine rehabilitation consult.   Review of Systems  Constitutional: Positive for malaise/fatigue. Negative for chills and fever.  HENT: Negative for hearing loss.   Eyes: Negative for blurred vision and double vision.  Respiratory: Negative for shortness of breath.   Cardiovascular: Negative for chest pain and leg swelling.  Gastrointestinal: Positive for constipation. Negative for nausea.       GERD  Genitourinary: Negative for flank pain and hematuria.  Musculoskeletal: Positive for joint pain and myalgias.  Skin: Negative for rash.  Neurological: Positive for weakness.  Psychiatric/Behavioral: Positive for depression. The patient has insomnia.   All other systems reviewed and are negative.  Past Medical History:  Diagnosis Date  . Depression   . GERD (gastroesophageal reflux disease)   . HLD (hyperlipidemia)   . HTN (hypertension)   . IBS (irritable bowel syndrome)   . Insomnia    Past Surgical History:  Procedure Laterality Date  . TOE AMPUTATION     Family History  Problem  Relation Age of Onset  . Asthma Mother   . Diabetes Mellitus II Father    Social History:  reports that she has never smoked. She has never used smokeless tobacco. She reports that she does not drink alcohol or use drugs. Allergies:  Allergies  Allergen Reactions  . Penicillins Other (See Comments)    DID THE REACTION INVOLVE: Swelling of the face/tongue/throat, SOB, or low BP? Unknown Sudden or severe rash/hives, skin peeling, or the inside of the mouth or nose? Unknown Did it require medical treatment? Unknown When did it last happen?very long ago If all above answers are "NO", may proceed with cephalosporin use.    . Sulfa Antibiotics     Other reaction(s): Other Been so long not sure anymore   Medications Prior to Admission  Medication Sig Dispense Refill  . acetaminophen (TYLENOL) 325 MG tablet Take 650 mg by mouth every 6 (six) hours as needed for mild pain or fever.    . bisacodyl (DULCOLAX) 10 MG suppository Place 10 mg rectally as needed (NO bowel movement in 2 days).    . Cholecalciferol (VITAMIN D3) 50 MCG (2000 UT) TABS Take 2,000 Units by mouth every morning.     . diazepam (VALIUM) 2 MG tablet Take 4 mg by mouth at bedtime.    . docusate sodium (COLACE) 100 MG capsule Take 100 mg by mouth 2 (two) times daily.    . famotidine (PEPCID) 20 MG tablet Take 20 mg by mouth at bedtime.    . feeding supplement, ENSURE ENLIVE, (ENSURE ENLIVE) LIQD Take 120 mLs by mouth as needed (nutrition intake). CHOCOLATE FLAVOR    . Inulin (FIBER CHOICE FRUITY BITES PO) Take 2 tablets by mouth daily as needed (constipation).    .Marland Kitchen  loperamide (IMODIUM) 2 MG capsule Take 2 mg by mouth as needed for diarrhea or loose stools (up to 4 times daily for diarrhe).    . Multiple Vitamin (THERA) TABS Take 1 tablet by mouth every morning.    Marland Kitchen OLANZapine (ZYPREXA) 2.5 MG tablet Take 2.5 mg by mouth at bedtime.    . pantoprazole (PROTONIX) 40 MG tablet Take 40 mg by mouth every morning.    .  polyethylene glycol (MIRALAX / GLYCOLAX) packet Take 17 g by mouth daily as needed (NO bowel movement in 1 day before).    Marland Kitchen senna (SENOKOT) 8.6 MG TABS tablet Take 1 tablet by mouth daily as needed for mild constipation.    . simethicone (MYLICON) 80 MG chewable tablet Chew 80 mg by mouth 3 (three) times daily as needed for flatulence (after meals and bedtime for gas).    . trimethoprim (TRIMPEX) 100 MG tablet Take 50 mg by mouth every morning.      Home: Home Living Family/patient expects to be discharged to:: Skilled nursing facility  Functional History: Prior Function Level of Independence: Independent with assistive device(s) Functional Status:  Mobility:   Transfers Overall transfer level: Needs assistance Equipment used: Rolling walker (2 wheeled) Transfers: Sit to/from Stand Sit to Stand: Mod assist, +2 safety/equipment General transfer comment: Light mod assist to power up to stand and steady while pt moved hands to RW Ambulation/Gait Ambulation/Gait assistance: +2 safety/equipment, Mod assist Gait Distance (Feet): 3 Feet(approx 6 steps) Assistive device: Rolling walker (2 wheeled) Gait Pattern/deviations: Step-to pattern, Antalgic, Trunk flexed General Gait Details: Cues for sequence and to bear down through RW to unweigh painful R hip in stance; became quite dizzy with upright activity and we stopped amb trial early because of that    ADL:    Cognition: Cognition Overall Cognitive Status: Within Functional Limits for tasks assessed Orientation Level: Oriented to person Cognition Arousal/Alertness: Awake/alert Behavior During Therapy: WFL for tasks assessed/performed Overall Cognitive Status: Within Functional Limits for tasks assessed  Blood pressure (!) 90/48, pulse 69, temperature (!) 97.5 F (36.4 C), temperature source Axillary, resp. rate 16, height 5' (1.524 m), weight 58.8 kg, SpO2 92 %. Physical Exam  Vitals reviewed. Constitutional: She appears  well-developed.  Frail  HENT:  Head: Normocephalic and atraumatic.  Eyes: EOM are normal. Right eye exhibits no discharge. Left eye exhibits no discharge.  Neck: Normal range of motion. Neck supple. No thyromegaly present.  Cardiovascular: Normal rate and regular rhythm.  Respiratory: Effort normal and breath sounds normal. No respiratory distress.  GI: Soft. Bowel sounds are normal. She exhibits no distension.  Musculoskeletal:     Comments: Right hip with tenderness and edema  Neurological: She is alert.  Motor: Bilateral upper extremities: 3+-4-/5 proximal distal Left lower extremity: Flexion, knee extension 2+/5, ankle dorsiflexion 4-/5 Right lower extremity: Hip flexion, knee extension 2-/5, ankle dorsiflexion 3/5 (some pain inhibition)  Skin:  Hip incision is dressed.  Psychiatric: Her mood appears anxious. Her speech is delayed and tangential. She is slowed.  Disoriented and confused    Results for orders placed or performed during the hospital encounter of 01/08/18 (from the past 24 hour(s))  CBC     Status: Abnormal   Collection Time: 01/10/18  7:25 AM  Result Value Ref Range   WBC 13.5 (H) 4.0 - 10.5 K/uL   RBC 2.35 (L) 3.87 - 5.11 MIL/uL   Hemoglobin 7.4 (L) 12.0 - 15.0 g/dL   HCT 60.4 (L) 54.0 - 98.1 %  MCV 95.3 80.0 - 100.0 fL   MCH 31.5 26.0 - 34.0 pg   MCHC 33.0 30.0 - 36.0 g/dL   RDW 16.1 09.6 - 04.5 %   Platelets 202 150 - 400 K/uL   nRBC 0.0 0.0 - 0.2 %  Basic metabolic panel     Status: Abnormal   Collection Time: 01/10/18  7:25 AM  Result Value Ref Range   Sodium 138 135 - 145 mmol/L   Potassium 4.5 3.5 - 5.1 mmol/L   Chloride 111 98 - 111 mmol/L   CO2 20 (L) 22 - 32 mmol/L   Glucose, Bld 103 (H) 70 - 99 mg/dL   BUN 27 (H) 8 - 23 mg/dL   Creatinine, Ser 4.09 (H) 0.44 - 1.00 mg/dL   Calcium 8.1 (L) 8.9 - 10.3 mg/dL   GFR calc non Af Amer 29 (L) >60 mL/min   GFR calc Af Amer 33 (L) >60 mL/min   Anion gap 7 5 - 15  Prepare RBC     Status: None    Collection Time: 01/10/18  8:29 AM  Result Value Ref Range   Order Confirmation      ORDER PROCESSED BY BLOOD BANK Performed at [facility], [address] [place], [place] [address]   Urinalysis, Routine w reflex microscopic     Status: None   Collection Time: 01/10/18 11:01 AM  Result Value Ref Range   Color, Urine YELLOW YELLOW   APPearance CLEAR CLEAR   Specific Gravity, Urine 1.017 1.005 - 1.030   pH 5.0 5.0 - 8.0   Glucose, UA NEGATIVE NEGATIVE mg/dL   Hgb urine dipstick NEGATIVE NEGATIVE   Bilirubin Urine NEGATIVE NEGATIVE   Ketones, ur NEGATIVE NEGATIVE mg/dL   Protein, ur NEGATIVE NEGATIVE mg/dL   Nitrite NEGATIVE NEGATIVE   Leukocytes, UA NEGATIVE NEGATIVE   Dg Chest 1 View  Result Date: 01/08/2018 CLINICAL DATA:  84 year old nursing home patient fell and sustained a RIGHT femoral neck fracture today. Preoperative evaluation. EXAM: CHEST  1 VIEW COMPARISON:  None. FINDINGS: Cardiac silhouette normal in size. Thoracic aorta atherosclerotic. Hilar and mediastinal contours otherwise unremarkable. Streaky and patchy airspace opacities involving the LEFT base with silhouetting of the LEFT hemidiaphragm. Minimal linear atelectasis involving the RIGHT lung base. Lungs otherwise clear. Normal pulmonary vascularity. IMPRESSION: 1. Atelectasis and/or bronchopneumonia involving the LEFT lower lobe. 2. Minimal linear atelectasis involving the RIGHT lung base. Electronically Signed   By: Hulan Saas M.D.   On: 01/08/2018 12:43   Dg C-arm 1-60 Min  Result Date: 01/08/2018 CLINICAL DATA:  Status post surgery for fixation of the right femur. EXAM: RIGHT FEMUR 2 VIEWS; DG C-ARM 61-120 MIN COMPARISON:  None. FINDINGS: Four fluoroscopic images demonstrate interval insertion at a right femoral rod through fracture of the intertrochanteric region right femur. No gross malalignment is identified. IMPRESSION: Status post fixation of the right femur is described. Electronically Signed    By: Sherian Rein M.D.   On: 01/08/2018 22:31   Dg Hip Unilat W Or Wo Pelvis 2-3 Views Right  Result Date: 01/08/2018 CLINICAL DATA:  84 year old and recent fall. Right upper thigh pain. Evaluate for fracture. EXAM: DG HIP (WITH OR WITHOUT PELVIS) 2-3V RIGHT COMPARISON:  None. FINDINGS: There is a displaced fracture involving the right femoral intertrochanteric region best seen on the cross-table lateral view. The lesser trochanter is displaced medially and superiorly. The right femoral head is located. Pelvic bony ring is intact. IMPRESSION: Right femoral intertrochanteric fracture. Electronically Signed  By: Richarda Overlie M.D.   On: 01/08/2018 12:46   Dg Femur, Min 2 Views Right  Result Date: 01/08/2018 CLINICAL DATA:  Status post surgery for fixation of the right femur. EXAM: RIGHT FEMUR 2 VIEWS; DG C-ARM 61-120 MIN COMPARISON:  None. FINDINGS: Four fluoroscopic images demonstrate interval insertion at a right femoral rod through fracture of the intertrochanteric region right femur. No gross malalignment is identified. IMPRESSION: Status post fixation of the right femur is described. Electronically Signed   By: Sherian Rein M.D.   On: 01/08/2018 22:31   Dg Femur, Min 2 Views Right  Result Date: 01/08/2018 CLINICAL DATA:  Right hip fracture EXAM: RIGHT FEMUR 2 VIEWS COMPARISON:  Same day pelvis and right hip radiographs FINDINGS: The proximal right femur are included as part of the pelvis and right hip study. Mid to distal right femur and knee joint are nonacute. Minimal soft tissue calcification overlying the quadriceps tendon may reflect a small phlebolith. Mild degenerative joint space narrowing of the femorotibial compartment. The AP view is slightly oblique limiting further assessment. IMPRESSION: No acute osseous abnormality of the mid to distal right femur. Please refer to the right hip radiographs for an intertrochanteric fracture seen of the right femur. Electronically Signed   By: Tollie Eth M.D.   On: 01/08/2018 14:12    Assessment/Plan: Diagnosis: right intertrochanteric femur fracture s/p medullary nailing Labs and images (see above) independently reviewed.  Records reviewed and summated above.  1. Does the need for close, 24 hr/day medical supervision in concert with the patient's rehab needs make it unreasonable for this patient to be served in a less intensive setting? Potentially  2. Co-Morbidities requiring supervision/potential complications: HTN (monitor and provide prns in accordance with increased physical exertion and pain), irritable bowel syndrome, sleep disturbance (meds), postoperative pain (Biofeedback training with therapies to help reduce reliance on opiate pain medications, monitor pain control during therapies, and sedation at rest and titrate to maximum efficacy to ensure participation and gains in therapies),  Acute blood loss anemia (repeat labs, transfusion in progress, transfuse again to ensure appropriate perfusion for increased activity tolerance), leukocytosis (repeat labs, cont to monitor for signs and symptoms of infection, further workup if indicated) 3. Due to safety, skin/wound care, disease management, pain management and patient education, does the patient require 24 hr/day rehab nursing? Yes 4. Does the patient require coordinated care of a physician, rehab nurse, PT (1-2 hrs/day, 5 days/week) and OT (1-2 hrs/day, 5 days/week) to address physical and functional deficits in the context of the above medical diagnosis(es)? Yes Addressing deficits in the following areas: balance, endurance, locomotion, strength, transferring, bathing, dressing, toileting and psychosocial support 5. Can the patient actively participate in an intensive therapy program of at least 3 hrs of therapy per day at least 5 days per week? No 6. The potential for patient to make measurable gains while on inpatient rehab is excellent 7. Anticipated functional outcomes upon  discharge from inpatient rehab are min assist and mod assist  with PT, min assist and mod assist with OT, n/a with SLP. 8. Estimated rehab length of stay to reach the above functional goals is: 16-19 days. 9. Anticipated D/C setting: SNF. 10. Anticipated post D/C treatments: SNF 11. Overall Rehab/Functional Prognosis: good  RECOMMENDATIONS: This patient's condition is appropriate for continued rehabilitative care in the following setting: Therapies recommending SNF.  Patient unable to tolerate 3 hours of therapy at this time and will require caregiver support at discharge. Will consider CIR  if both the aforementioned changes. Patient has agreed to participate in recommended program. Potentially Note that insurance prior authorization may be required for reimbursement for recommended care.  Comment: Rehab Admissions Coordinator to follow up.   I have personally performed a face to face diagnostic evaluation, including, but not limited to relevant history and physical exam findings, of this patient and developed relevant assessment and plan.  Additionally, I have reviewed and concur with the physician assistant's documentation above.   Maryla MorrowAnkit Rexann Lueras, MD, ABPMR Mcarthur Rossettianiel J Angiulli, PA-C 01/10/2018

## 2018-01-10 NOTE — Progress Notes (Signed)
Pt is less hostile. [facility]. She is still disoriented to time and place, although she is more amenable to help from the staff.

## 2018-01-10 NOTE — Progress Notes (Signed)
Inpatient Rehabilitation Admissions Coordinator  I met with patient and her daughter, Kieth Brightly, at bedside. I discussed Dr. Thelma Comp Patel's recommendation for SNF. They are in agreement to go to [facility]. We will sign off at this time.  Danne Baxter, RN, MSN Rehab Admissions Coordinator [phone] 01/10/2018 3:24 PM

## 2018-01-10 NOTE — NC FL2 (Signed)
[place] MEDICAID FL2 LEVEL OF CARE SCREENING TOOL     IDENTIFICATION  Patient Name: Jenna Rogers Birthdate: 04/28/1922 Sex: female Admission Date (Current Location): 01/08/2018  [facility] [place]   Facility and Address:  [facility] [place], [address], [place], [place] [address]      Provider Number: [id number]  Attending Physician Name and Address:  Osvaldo ShipperKrishnan, Gokul, MD  Relative Name and Phone Number:  [name] [phone]    Current Level of Care: Hospital Recommended Level of Care: Skilled Nursing Facility Prior Approval Number:    Date Approved/Denied: 05/10/17 PASRR Number: [id number]  Discharge Plan: SNF    Current Diagnoses: Patient Active Problem List   Diagnosis Date Noted  . Hip fracture (HCC) 01/08/2018  . Insomnia   . IBS (irritable bowel syndrome)   . GERD (gastroesophageal reflux disease)   . Depression     Orientation RESPIRATION BLADDER Height & Weight     Self(dementia)  Normal Continent Weight: 58.8 kg Height:  5' (152.4 cm)  BEHAVIORAL SYMPTOMS/MOOD NEUROLOGICAL BOWEL NUTRITION STATUS      Continent Diet(see discharge summary)  AMBULATORY STATUS COMMUNICATION OF NEEDS Skin   Extensive Assist Verbally Surgical wounds(right hip closed surgical incision)                       Personal Care Assistance Level of Assistance  Bathing, Feeding, Dressing, Total care Bathing Assistance: Maximum assistance Feeding assistance: Independent Dressing Assistance: Maximum assistance Total Care Assistance: Maximum assistance   Functional Limitations Info  Sight, Hearing, Speech Sight Info: Adequate Hearing Info: Adequate Speech Info: Adequate    SPECIAL CARE FACTORS FREQUENCY  PT (By licensed PT), OT (By licensed OT)     PT Frequency: min 5x weekly OT Frequency: min 5x weekly            Contractures Contractures Info: Not present    Additional Factors Info  Code Status, Allergies Code  Status Info: DNR Allergies Info: Penicillins, Sulfa Antibiotics           Current Medications (01/10/2018):  This is the current hospital active medication list Current Facility-Administered Medications  Medication Dose Route Frequency Provider Last Rate Last Dose  . 0.9 %  sodium chloride infusion (Manually program via Guardrails IV Fluids)   Intravenous Once Osvaldo ShipperKrishnan, Gokul, MD      . acetaminophen (TYLENOL) tablet 650 mg  650 mg Oral Q6H PRN Debe CoderMullen, Emily B, MD   325 mg at 01/10/18 [id number]   Or  . acetaminophen (TYLENOL) suppository 650 mg  650 mg Rectal Q6H PRN Inez CatalinaMullen, Emily B, MD      . alum & mag hydroxide-simeth (MAALOX/MYLANTA) 200-200-20 MG/5ML suspension 15 mL  15 mL Oral Q6H PRN Osvaldo ShipperKrishnan, Gokul, MD      . bisacodyl (DULCOLAX) suppository 10 mg  10 mg Rectal Daily PRN Osvaldo ShipperKrishnan, Gokul, MD      . cholecalciferol (VITAMIN D3) tablet 2,000 Units  2,000 Units Oral Daily Osvaldo ShipperKrishnan, Gokul, MD   2,000 Units at 01/10/18 1032  . diazepam (VALIUM) tablet 2 mg  2 mg Oral QHS Osvaldo ShipperKrishnan, Gokul, MD      . docusate sodium (COLACE) capsule 100 mg  100 mg Oral BID Terance HartAdair, Christopher R, MD   100 mg at 01/10/18 0820  . enoxaparin (LOVENOX) injection 40 mg  40 mg Subcutaneous Q24H Terance HartAdair, Christopher R, MD   40 mg at 01/10/18 0820  . famotidine (PEPCID) tablet 20 mg  20 mg  Oral QHS Inez CatalinaMullen, Emily B, MD   20 mg at 01/09/18 2150  . fentaNYL (SUBLIMAZE) injection 12.5 mcg  12.5 mcg Intravenous PRN Shelton SilvasHollis, Kevin D, MD   12.5 mcg at 01/08/18 1538  . fentaNYL (SUBLIMAZE) injection 50 mcg  50 mcg Intravenous Q1H PRN Inez CatalinaMullen, Emily B, MD   50 mcg at 01/08/18 1305  . HYDROcodone-acetaminophen (NORCO/VICODIN) 5-325 MG per tablet 1-2 tablet  1-2 tablet Oral Q4H PRN Terance HartAdair, Christopher R, MD   1 tablet at 01/10/18 1130  . metoCLOPramide (REGLAN) tablet 5-10 mg  5-10 mg Oral Q8H PRN Terance HartAdair, Christopher R, MD       Or  . metoCLOPramide (REGLAN) injection 5-10 mg  5-10 mg Intravenous Q8H PRN Terance HartAdair, Christopher R, MD      .  morphine 2 MG/ML injection 0.5-1 mg  0.5-1 mg Intravenous Q2H PRN Terance HartAdair, Christopher R, MD      . multivitamin with minerals tablet 1 tablet  1 tablet Oral Daily Osvaldo ShipperKrishnan, Gokul, MD      . OLANZapine [facility](ZYPREXA) tablet 2.5 mg  2.5 mg Oral QHS Debe CoderMullen, Emily B, MD   2.5 mg at 01/09/18 2150  . ondansetron (ZOFRAN) tablet 4 mg  4 mg Oral Q6H PRN Inez CatalinaMullen, Emily B, MD       Or  . ondansetron [facility](ZOFRAN) injection 4 mg  4 mg Intravenous Q6H PRN Debe CoderMullen, Emily B, MD      . oxyCODONE (Oxy IR/ROXICODONE) immediate release tablet 5 mg  5 mg Oral Q4H PRN Inez CatalinaMullen, Emily B, MD      . pantoprazole (PROTONIX) EC tablet 40 mg  40 mg Oral Daily Debe CoderMullen, Emily B, MD   40 mg at 01/10/18 0820  . polyethylene glycol (MIRALAX / GLYCOLAX) packet 17 g  17 g Oral Daily Osvaldo ShipperKrishnan, Gokul, MD   17 g at 01/10/18 0820  . senna-docusate (Senokot-S) tablet 1 tablet  1 tablet Oral QHS Osvaldo ShipperKrishnan, Gokul, MD   1 tablet at 01/09/18 2150     Discharge Medications: Please see discharge summary for a list of discharge medications.  Relevant Imaging Results:  Relevant Lab Results:   Additional Information SSN: [id number]  Gildardo GriffesAshley M Burnetta Kohls, LCSW

## 2018-01-10 NOTE — Progress Notes (Signed)
Inpatient Rehabilitation Admissions Coordinator  Received call from Jenna Rogers, Ortho Case manager . Dr Luiz BlareGraves is requesting that patient, his Mother in law, be considered for an inpt rehab admission rather than SNF. [facility] at [facility]. I have requested Dr. Susa SimmondsAdair place order for consult so that we can assess if patient would be a candidate. We await consult order.  Jenna GlazierBarbara Altair Appenzeller, RN, MSN Rehab Admissions Coordinator [phone] 01/10/2018 11:25 AM

## 2018-01-10 NOTE — Clinical Social Work Note (Signed)
Clinical Social Work Assessment  Patient Details  Name: Jenna Rogers MRN: [id number] Date of Birth: 01/05/1923  Date of referral:  01/10/18               Reason for consult:  Discharge Planning                Permission sought to share information with:  Case Manager, Facility Medical sales representativeContact Representative, Family Supports Permission granted to share information::  Yes, Verbal Permission Granted  Name::     Jenna Rogers  Agency::  SNFs  Relationship::  family/friend  Contact Information:  [phone]  Housing/Transportation Living arrangements for the past 2 months:  Single Family Home Source of Information:  Other (Comment Required)(family at bedside) Patient Interpreter Needed:  None Criminal Activity/Legal Involvement Pertinent to Current Situation/Hospitalization:  No - Comment as needed Significant Relationships:  Adult Children Lives with:  Self Do you feel safe going back to the place where you live?  No Need for family participation in patient care:  Yes (Comment)  Care giving concerns:  CSW received referral for possible SNF placement at time of discharge. Spoke with patient regarding possibility of SNF placement . Patient's  family  is currently unable to care for her at their home given patient's current needs and fall risk.  Patient and  Family (Jenna Rogers at bedside) expressed understanding of PT recommendation and are agreeable to SNF placement at time of discharge. CSW to continue to follow and assist with discharge planning needs.     Social Worker assessment / plan:  Spoke with Jenna Rogers at bedside due to patient having dementia concerning [facility].    Employment status:  Retired Health and safety inspectornsurance information:  Medicare PT Recommendations:  Skilled Nursing Facility Information / Referral to community resources:  Skilled Nursing Facility  Patient/Family's Response to care:  Jenna Rogers at bedside recognize need for rehab before returning home and are  [place]. They report preference for  [facility] as patient has been there before. CSW explained insurance authorization process. Patient's family reported that they want patient to get stronger to be able to come back home.    Patient/Family's Understanding of and Emotional Response to Diagnosis, Current Treatment, and Prognosis:  Patient/family is realistic regarding therapy needs and expressed being hopeful for SNF placement. Patient expressed understanding of CSW role and discharge process as well as medical condition. No questions/concerns about plan or treatment.    Emotional Assessment Appearance:  Appears stated age Attitude/Demeanor/Rapport:  Unable to Assess Affect (typically observed):  Unable to Assess Orientation:  Oriented to Self Alcohol / Substance use:  Not Applicable Psych involvement (Current and /or in the community):  No (Comment)  Discharge Needs  Concerns to be addressed:  Discharge Planning Concerns Readmission within the last 30 days:  No Current discharge risk:  Dependent with Mobility Barriers to Discharge:  Continued Medical Work up   Jenna M Anabella Capshaw, LCSW 01/10/2018, 11:43 AM

## 2018-01-10 NOTE — Progress Notes (Signed)
Pt has pulled her dressing off twice. Area cleaned and redressed. Her hands were cleaned since she is scratching the incisions. She is also picking at her IV and it is seeping. Area cleaned and redressed. Pt is not hostile, but is still insisting that we are trying to poison her.

## 2018-01-10 NOTE — Progress Notes (Signed)
Subjective: 2 Days Post-Op Procedure(s) (LRB): IM FEMORAL NAIL- RIGHT (Right)  Patient had some issues with agitation overnight.  This morning she is appropriate.  She endorses being cold.  Pain is controlled.  She is able to get up to bedside commode with assistance overnight.  Denies any shortness of breath or dizziness.  Objective: Vital signs in last 24 hours: Temp:  [97.5 F (36.4 C)-97.6 F (36.4 C)] 97.5 F (36.4 C) (12/30 0526) Pulse Rate:  [67-73] 69 (12/30 0526) Resp:  [16-18] 16 (12/30 0400) BP: (87-101)/(48-56) 90/48 (12/30 0526) SpO2:  [92 %-96 %] 92 % (12/30 0526) Weight:  [58.8 kg] 58.8 kg (12/30 0100)  Intake/Output from previous day: 12/29 0701 - 12/30 0700 In: 940 [P.O.:940] Out: 450 [Urine:450] Intake/Output this shift: No intake/output data recorded.  Recent Labs    01/08/18 1153 01/09/18 0611 01/10/18 0725  HGB 12.9 8.9* 7.4*   Recent Labs    01/09/18 0611 01/10/18 0725  WBC 10.6* 13.5*  RBC 2.84* 2.35*  HCT 27.0* 22.4*  PLT 196 202   Recent Labs    01/09/18 0611 01/10/18 0725  NA 138 138  K 4.8 4.5  CL 109 111  CO2 19* 20*  BUN 27* 27*  CREATININE 1.71* 1.54*  GLUCOSE 183* 103*  CALCIUM 8.0* 8.1*   Recent Labs    01/08/18 1153  INR 0.99    Awake and alert.  Answers questions appropriately.  Is oriented to self. Dressings the right thigh were inspected.  She had removed some and they were replaced.  No saturations. Patient is able to dorsiflex and plantarflex the ankle.  Endorses sensation the dorsal plantar surface of the foot.  No pain with calf squeeze.   Assessment/Plan: 2 Days Post-Op Procedure(s) (LRB): IM FEMORAL NAIL- RIGHT (Right)  Patient had some issues with agitation and disorientation overnight.  This is improved this morning.  Her hemoglobin this morning has dropped to 7.4.  Plan for transfusion per medical team.  She will continue to work with physical therapy and work on disposition planning.  She will be  discharged on 325 mg aspirin. She will follow-up with me in 2 weeks for x-rays.   Terance HartChristopher R Adair 01/10/2018, 9:52 AM

## 2018-01-10 NOTE — Plan of Care (Signed)
  Problem: Health Behavior/Discharge Planning: Goal: Ability to manage health-related needs will improve Outcome: Progressing   Problem: Nutrition: Goal: Adequate nutrition will be maintained Outcome: Progressing   Problem: Coping: Goal: Level of anxiety will decrease Outcome: Progressing   Problem: Safety: Goal: Ability to remain free from injury will improve Outcome: Progressing   Problem: Skin Integrity: Goal: Risk for impaired skin integrity will decrease Outcome: Progressing   

## 2018-01-10 NOTE — Progress Notes (Signed)
[facility] PROGRESS NOTE  KEELIN NEVILLE ZOX:[id number] DOB: 1922/03/04 DOA: 01/08/2018  PCP: System, Provider Not In  Brief History/Interval Summary: 84 y.o. female with medical history significant of Depression, IBS, Insomnia, HLD, HTN, GERD who presented after a fall at her ALF and severe hip pain.  This was a mechanical fall.  She was found to have right hip fracture.  She was admitted to the hospital.  Reason for Visit: Right hip fracture  Consultants: Orthopedics  Procedures: ORIF right hip 12/28  Antibiotics: None  Subjective/Interval History: Overnight events noted.  Patient was noted to be delirious overnight.  She still has some degree of paranoia this morning.  Continues to have pain in the right hip.  Patient's son-in-law is at the bedside.  Complains of burning with urination.  Recently completed a course of antibiotics about a month ago.  ROS: Denies any shortness of breath  Objective:  Vital Signs  Vitals:   01/09/18 2004 01/10/18 0100 01/10/18 0400 01/10/18 0526  BP: (!) 93/51   (!) 90/48  Pulse: 73   69  Resp:   16   Temp: (!) 97.5 F (36.4 C)   (!) 97.5 F (36.4 C)  TempSrc: Oral   Axillary  SpO2: 95%   92%  Weight:  58.8 kg    Height:  5' (1.524 m)      Intake/Output Summary (Last 24 hours) at 01/10/2018 0827 Last data filed at 01/10/2018 0100 Gross per 24 hour  Intake 580 ml  Output 450 ml  Net 130 ml   Filed Weights   01/10/18 0100  Weight: 58.8 kg   General appearance: Awake alert.  In no distress.  Distracted Resp: Clear to auscultation bilaterally.  Normal effort Cardio: S1-S2 is normal regular.  No S3-S4.  No rubs murmurs or bruit GI: Abdomen is soft.  Mildly tender in the suprapubic area without any rebound rigidity or guarding.  Nondistended.  Bowel sounds are present normal.  No masses organomegaly Extremities: No edema.  Full range of motion of lower extremities. Neurologic: Alert.  Oriented to place person.  Distracted.   No focal neurological deficits.   Lab Results:  Data Reviewed: I have personally reviewed following labs and imaging studies  CBC: Recent Labs  Lab 01/08/18 1153 01/09/18 0611 01/10/18 0725  WBC 12.3* 10.6* 13.5*  NEUTROABS 9.2*  --   --   HGB 12.9 8.9* 7.4*  HCT 40.8 27.0* 22.4*  MCV 96.0 95.1 95.3  PLT 242 196 202    Basic Metabolic Panel: Recent Labs  Lab 01/08/18 1153 01/09/18 0611  NA 138 138  K 4.2 4.8  CL 111 109  CO2 18* 19*  GLUCOSE 164* 183*  BUN 27* 27*  CREATININE 1.63* 1.71*  CALCIUM 9.2 8.0*    GFR: Estimated Creatinine Clearance: 16.5 mL/min (A) (by C-G formula based on SCr of 1.71 mg/dL (H)).   Coagulation Profile: Recent Labs  Lab 01/08/18 1153  INR 0.99      Radiology Studies: Dg Chest 1 View  Result Date: 01/08/2018 CLINICAL DATA:  84 year old nursing home patient fell and sustained a RIGHT femoral neck fracture today. Preoperative evaluation. EXAM: CHEST  1 VIEW COMPARISON:  None. FINDINGS: Cardiac silhouette normal in size. Thoracic aorta atherosclerotic. Hilar and mediastinal contours otherwise unremarkable. Streaky and patchy airspace opacities involving the LEFT base with silhouetting of the LEFT hemidiaphragm. Minimal linear atelectasis involving the RIGHT lung base. Lungs otherwise clear. Normal pulmonary vascularity. IMPRESSION: 1. Atelectasis and/or bronchopneumonia involving the  LEFT lower lobe. 2. Minimal linear atelectasis involving the RIGHT lung base. Electronically Signed   By: Hulan Saashomas  Lawrence M.D.   On: 01/08/2018 12:43   Dg C-arm 1-60 Min  Result Date: 01/08/2018 CLINICAL DATA:  Status post surgery for fixation of the right femur. EXAM: RIGHT FEMUR 2 VIEWS; DG C-ARM 61-120 MIN COMPARISON:  None. FINDINGS: Four fluoroscopic images demonstrate interval insertion at a right femoral rod through fracture of the intertrochanteric region right femur. No gross malalignment is identified. IMPRESSION: Status post fixation of the  right femur is described. Electronically Signed   By: Sherian ReinWei-Chen  Lin M.D.   On: 01/08/2018 22:31   Dg Hip Unilat W Or Wo Pelvis 2-3 Views Right  Result Date: 01/08/2018 CLINICAL DATA:  84 year old and recent fall. Right upper thigh pain. Evaluate for fracture. EXAM: DG HIP (WITH OR WITHOUT PELVIS) 2-3V RIGHT COMPARISON:  None. FINDINGS: There is a displaced fracture involving the right femoral intertrochanteric region best seen on the cross-table lateral view. The lesser trochanter is displaced medially and superiorly. The right femoral head is located. Pelvic bony ring is intact. IMPRESSION: Right femoral intertrochanteric fracture. Electronically Signed   By: Richarda OverlieAdam  Henn M.D.   On: 01/08/2018 12:46   Dg Femur, Min 2 Views Right  Result Date: 01/08/2018 CLINICAL DATA:  Status post surgery for fixation of the right femur. EXAM: RIGHT FEMUR 2 VIEWS; DG C-ARM 61-120 MIN COMPARISON:  None. FINDINGS: Four fluoroscopic images demonstrate interval insertion at a right femoral rod through fracture of the intertrochanteric region right femur. No gross malalignment is identified. IMPRESSION: Status post fixation of the right femur is described. Electronically Signed   By: Sherian ReinWei-Chen  Lin M.D.   On: 01/08/2018 22:31   Dg Femur, Min 2 Views Right  Result Date: 01/08/2018 CLINICAL DATA:  Right hip fracture EXAM: RIGHT FEMUR 2 VIEWS COMPARISON:  Same day pelvis and right hip radiographs FINDINGS: The proximal right femur are included as part of the pelvis and right hip study. Mid to distal right femur and knee joint are nonacute. Minimal soft tissue calcification overlying the quadriceps tendon may reflect a small phlebolith. Mild degenerative joint space narrowing of the femorotibial compartment. The AP view is slightly oblique limiting further assessment. IMPRESSION: No acute osseous abnormality of the mid to distal right femur. Please refer to the right hip radiographs for an intertrochanteric fracture seen of the  right femur. Electronically Signed   By: Tollie Ethavid  Kwon M.D.   On: 01/08/2018 14:12     Medications:  Scheduled: . sodium chloride   Intravenous Once  . diazepam  2 mg Oral QHS  . docusate sodium  100 mg Oral BID  . enoxaparin (LOVENOX) injection  40 mg Subcutaneous Q24H  . famotidine  20 mg Oral QHS  . OLANZapine  2.5 mg Oral QHS  . pantoprazole  40 mg Oral Daily  . polyethylene glycol  17 g Oral Daily  . senna-docusate  1 tablet Oral QHS  . THERA  1 tablet Oral BH-q7a  . Vitamin D3  2,000 Units Oral BH-q7a   Continuous:  ZOX:[id number]:acetaminophen **OR** acetaminophen, alum & mag hydroxide-simeth, bisacodyl, fentaNYL (SUBLIMAZE) injection, fentaNYL (SUBLIMAZE) injection, HYDROcodone-acetaminophen, metoCLOPramide **OR** metoCLOPramide (REGLAN) injection, morphine injection, ondansetron **OR** ondansetron (ZOFRAN) IV, oxyCODONE    Assessment/Plan:  Right hip fracture This is a result of a mechanical fall.  Patient underwent surgery.  Experiencing some delirium as discussed below.  Otherwise seems to be stable.  Pain control.  PT and OT evaluation.  Plan  is for rehab at skilled nursing facility.  For now Lovenox for DVT prophylaxis.  Orthopedic surgeon recommends transitioning to aspirin 325 mg at discharge.  Acute delirium Most likely due to hospital stay, anesthesia, pain.  Patient does mention dysuria.  She has a history of frequent UTIs.  We will check a urine sample.  Reorient frequently.  Acute blood loss anemia likely due to operative loss Hemoglobin noted to be lower today compared to yesterday.  May benefit from a unit of blood which will be ordered.  Recheck CBC tomorrow.  No other bleeding noted.  Hypotension This is chronic per patient and her family.  She is asymptomatic.  Monitor periodically.  Not on any blood pressure lowering agents at this time.  Abnormal chest x-ray X-ray raised concern for atelectasis versus pneumonia.  Patient tells me that she has been getting over a  chest cold for the past few weeks.  She denies any expectoration.  No shortness of breath.  She is afebrile.  Incentive spirometry.  Acute on chronic kidney disease stage IV Old labs available in care everywhere.  Creatinine was 1.35 in April.  Creatinine was 1.7 yesterday.  1.54 this morning.  Continue to monitor urine output.  Avoid nephrotoxic agents.    Insomnia Continue diazepam  Irritable bowel syndrome Laxatives.  History of GERD Continue PPI and famotidine.  Maalox as needed.  History of depression Olanzapine  DVT Prophylaxis: On Lovenox    Code Status: DNR Family Communication: Discussed with the patient's son-in-law Disposition Plan: Management as outlined above.  Blood transfusion today.  Rule out urinary tract infection.  Social worker to facilitate skilled nursing facility placement.    LOS: 2 days   Osvaldo ShipperGokul Anntoinette Haefele  [facility] Pager [phone] 01/10/2018, 8:27 AM  If 7PM-7AM, please contact night-coverage at [email or web address], password [email or web address]

## 2018-01-11 DIAGNOSIS — S72001A Fracture of unspecified part of neck of right femur, initial encounter for closed fracture: Secondary | ICD-10-CM

## 2018-01-11 DIAGNOSIS — D62 Acute posthemorrhagic anemia: Secondary | ICD-10-CM

## 2018-01-11 DIAGNOSIS — R410 Disorientation, unspecified: Secondary | ICD-10-CM

## 2018-01-11 DIAGNOSIS — N179 Acute kidney failure, unspecified: Secondary | ICD-10-CM

## 2018-01-11 LAB — BASIC METABOLIC PANEL
Anion gap: 6 (ref 5–15)
BUN: 27 mg/dL — ABNORMAL HIGH (ref 8–23)
CALCIUM: 8.1 mg/dL — AB (ref 8.9–10.3)
CO2: 21 mmol/L — ABNORMAL LOW (ref 22–32)
CREATININE: 1.44 mg/dL — AB (ref 0.44–1.00)
Chloride: 113 mmol/L — ABNORMAL HIGH (ref 98–111)
GFR calc Af Amer: 36 mL/min — ABNORMAL LOW (ref >60)
GFR, EST NON AFRICAN AMERICAN: 31 mL/min — AB (ref >60)
Glucose, Bld: 105 mg/dL — ABNORMAL HIGH (ref 70–99)
Potassium: 4.9 mmol/L (ref 3.5–5.1)
Sodium: 140 mmol/L (ref 135–145)

## 2018-01-11 LAB — CBC
HCT: 28.7 % — ABNORMAL LOW (ref 36.0–46.0)
Hemoglobin: 9.1 g/dL — ABNORMAL LOW (ref 12.0–15.0)
MCH: 29.8 pg (ref 26.0–34.0)
MCHC: 31.7 g/dL (ref 30.0–36.0)
MCV: 94.1 fL (ref 80.0–100.0)
Platelets: 198 10*3/uL (ref 150–400)
RBC: 3.05 MIL/uL — ABNORMAL LOW (ref 3.87–5.11)
RDW: 13.6 % (ref 11.5–15.5)
WBC: 13.6 10*3/uL — ABNORMAL HIGH (ref 4.0–10.5)
nRBC: 0 % (ref 0.0–0.2)

## 2018-01-11 LAB — TYPE AND SCREEN
ABO/RH(D): A POS
Antibody Screen: NEGATIVE
Unit division: 0

## 2018-01-11 LAB — BPAM RBC
Blood Product Expiration Date: 202001162359
ISSUE DATE / TIME: 201912301320
Unit Type and Rh: 6200

## 2018-01-11 MED ORDER — HYDROCODONE-ACETAMINOPHEN 5-325 MG PO TABS: 1.0000 | ORAL_TABLET | Freq: Four times a day (QID) | ORAL | 0 refills | Status: AC | PRN

## 2018-01-11 MED ORDER — HYDROCODONE-ACETAMINOPHEN 5-325 MG PO TABS: 1.0000 | ORAL_TABLET | ORAL | 0 refills | Status: DC | PRN

## 2018-01-11 MED ORDER — ASPIRIN 325 MG PO TABS: 325.0000 mg | ORAL_TABLET | Freq: Every day | ORAL | 11 refills | Status: DC

## 2018-01-11 MED ORDER — ASPIRIN 325 MG PO TABS: 325.0000 mg | ORAL_TABLET | Freq: Every day | ORAL | Status: AC

## 2018-01-11 MED ORDER — DIAZEPAM 2 MG PO TABS: 2.0000 mg | ORAL_TABLET | Freq: Every day | ORAL | 0 refills | Status: AC

## 2018-01-11 NOTE — Clinical Social Work Placement (Signed)
   CLINICAL SOCIAL WORK PLACEMENT  NOTE  Date:  01/11/2018  Patient Details  Name: Jenna Rogers MRN: [id number] Date of Birth: 09/03/1922  Clinical Social Work is seeking post-discharge placement for this patient at the Skilled  Nursing Facility level of care (*CSW will initial, date and re-position this form in  chart as items are completed):  Yes   Patient/family provided with [place] Clinical Social Work Department's list of facilities offering this level of care within the geographic area requested by the patient (or if unable, by the patient's family).  Yes   Patient/family informed of their freedom to choose among providers that offer the needed level of care, that participate in Medicare, Medicaid or managed care program needed by the patient, have an available bed and are willing to accept the patient.  Yes   Patient/family informed of [facility]'s ownership interest in [facility] and [facility], as well as of the fact that they are under no obligation to receive care at these facilities.  PASRR submitted to EDS on       PASRR number received on 01/10/18     Existing PASRR number confirmed on       FL2 transmitted to all facilities in geographic area requested by pt/family on       FL2 transmitted to all facilities within larger geographic area on       Patient informed that his/her managed care company has contracts with or will negotiate with certain facilities, including the following:        Yes   Patient/family informed of bed offers received.  [facility]     Physician recommends and patient chooses bed at      [facility] [place] [facility].  Patient to be transferred to facility by PTAR     Patient family notified on 01/11/18 of transfer.  Name of family member notified:  Ival BiblePenny Graves      PHYSICIAN Please sign DNR     Additional Comment:     _______________________________________________ Eduard Rouxynthia N Ader Fritze, LCSWA 01/11/2018, 11:36 AM

## 2018-01-11 NOTE — Progress Notes (Addendum)
Pt will DC to: [facility] DC date: 01/11/2018 Family notified:Penny Graves  Transport JY:NWGNby:PTAR at 1:00pm  RN, patient, and facility notified of DC. Discharge Summary sent to facility. RN given number for report [phone] [id number], Room 310-A. DC packet on chart. Ambulance transport requested for patient.   CSW signing off. Antony Blackbirdynthia Timmy Cleverly, [facility] Clinical Social Worker [phone]

## 2018-01-11 NOTE — Clinical Social Work Placement (Signed)
   CLINICAL SOCIAL WORK PLACEMENT  NOTE  Date:  01/11/2018  Patient Details  Name: Jenna Rogers MRN: [id number] Date of Birth: 10/01/1922  Clinical Social Work is seeking post-discharge placement for this patient at the Skilled  Nursing Facility level of care (*CSW will initial, date and re-position this form in  chart as items are completed):  Yes   Patient/family provided with [place] Clinical Social Work Department's list of facilities offering this level of care within the geographic area requested by the patient (or if unable, by the patient's family).  Yes   Patient/family informed of their freedom to choose among providers that offer the needed level of care, that participate in Medicare, Medicaid or managed care program needed by the patient, have an available bed and are willing to accept the patient.  Yes   Patient/family informed of [facility]'s ownership interest in [facility] and [facility], as well as of the fact that they are under no obligation to receive care at these facilities.  PASRR submitted to EDS on       PASRR number received on 01/10/18     Existing PASRR number confirmed on       FL2 transmitted to all facilities in geographic area requested by pt/family on       FL2 transmitted to all facilities within larger geographic area on       Patient informed that his/her managed care company has contracts with or will negotiate with certain facilities, including the following:        Yes   Patient/family informed of bed offers received.  [facility]     Physician recommends and patient chooses bed at      [facility] [place] [facility].  Patient to be transferred to facility by [facility]     Patient family notified on 01/11/18 of transfer.  Name of family member notified:  Ival BiblePenny Graves      PHYSICIAN       Additional Comment:     _______________________________________________ Eduard Rouxynthia N Jance Siek, LCSWA 01/11/2018, 10:59 AM

## 2018-01-11 NOTE — Progress Notes (Addendum)
Subjective: 3 Days Post-Op Procedure(s) (LRB): IM FEMORAL NAIL- RIGHT (Right)  Patient is resting comfortably today.  She had a better night last night.  She was transfused 1 unit of blood yesterday for acute postoperative anemia.  Objective: Vital signs in last 24 hours: Temp:  [97.4 F (36.3 C)-98.3 F (36.8 C)] 98.2 F (36.8 C) (12/31 0445) Pulse Rate:  [73-78] 78 (12/31 0445) Resp:  [14-18] 15 (12/31 0445) BP: (103-137)/(38-100) 116/100 (12/31 0445) SpO2:  [92 %-98 %] 92 % (12/31 0445)  Intake/Output from previous day: 12/30 0701 - 12/31 0700 In: 370 [P.O.:360; I.V.:10] Out: 800 [Urine:800] Intake/Output this shift: No intake/output data recorded.  Recent Labs    01/08/18 1153 01/09/18 0611 01/10/18 0725 01/10/18 1831 01/11/18 0251  HGB 12.9 8.9* 7.4* 9.4* 9.1*   Recent Labs    01/10/18 0725 01/10/18 1831 01/11/18 0251  WBC 13.5*  --  13.6*  RBC 2.35*  --  3.05*  HCT 22.4* 29.5* 28.7*  PLT 202  --  198   Recent Labs    01/10/18 0725 01/11/18 0251  NA 138 140  K 4.5 4.9  CL 111 113*  CO2 20* 21*  BUN 27* 27*  CREATININE 1.54* 1.44*  GLUCOSE 103* 105*  CALCIUM 8.1* 8.1*   Recent Labs    01/08/18 1153  INR 0.99    Patient is resting comfortably Dressing intact Right lower extremity in normal resting position   Assessment/Plan: 3 Days Post-Op Procedure(s) (LRB): IM FEMORAL NAIL- RIGHT (Right)  Patient is doing well.  Her hemoglobin this morning is 9.1. She was evaluated by inpatient rehab and deemed suitable for SNF placement. Will likely discharge to SNF once cleared by medical team and bed becomes available. Patient will be discharged on 325 mg aspirin daily for DVT prophylaxis Pain medication and DVT prophylaxis written for She will follow-up with me in 2 weeks for x-rays and wound check She will call the office with any concerns in the meantime.     Jenna HartChristopher R Rogers 01/11/2018, 7:08 AM

## 2018-01-11 NOTE — Discharge Summary (Signed)
Physician Discharge Summary  Jenna Rogers ZOX:[id number] DOB: 10/30/1922 DOA: 01/08/2018  PCP: System, Provider Not In  Admit date: 01/08/2018 Discharge date: 01/11/2018  Recommendations for Outpatient Follow-up:   Right hip fracture status post mechanical fall, status post surgery 12/28.  Complications include acute blood loss anemia and delirium. --Continue management per orthopedics including: --Aspirin 325 mg daily at discharge --Follow-up with orthopedics in 2 weeks --Use Tylenol for mild pain, ibuprofen for moderate pain reserve hydrocodone for severe or breakthrough pain  Early pressure injury right heel --Keep heels elevated, reassess frequently   Follow-up Information    Terance HartAdair, Christopher R, MD. Schedule an appointment as soon as possible for a visit in 2 week(s).   Specialty:  Orthopedic Surgery Contact information: [address] [place] [address] [phone]            Discharge Diagnoses: Principal diagnosis is #1 1. Right hip fracture status post mechanical fall 2. Acute blood loss anemia secondary to surgery 3. Acute delirium, postoperative in nature 4. Chronic hypotension 5. Acute on chronic kidney disease stage IV (suspected).  Insomnia 6. GERD 7. Depression  Discharge Condition: improved Disposition: Short-term SNF  Diet recommendation: Regular  Filed Weights   01/10/18 0100  Weight: 58.8 kg    History of present illness:  84 year old woman PMH including depression, essential hypertension, presented with severe hip pain after mechanical fall at assisted living.  Admitted for definitive management of right hip fracture.  Hospital Course:  Patient was admitted and seen by orthopedics and underwent surgery 12/28 to address hip fracture.  Postoperative acute blood loss anemia was noted as well as delirium.  She required transfusion with 1 unit PRBC and hemoglobin has improved.  Delirium has improved.  She was cleared by orthopedics for  discharge.  PT recommended CIR, CIR recommended SNF.  Right hip fracture status post mechanical fall, status post surgery 12/28.  Complications include acute blood loss anemia and delirium. --Continue management per orthopedics including: --Aspirin 325 mg daily at discharge --Follow-up with orthopedics in 2 weeks  Acute blood loss anemia secondary to surgery --Hemoglobin 9.1 and stable.  Minimal leukocytosis is noted, unsurprising given surgery.  Acute delirium, postoperative in nature --Repeat urinalysis negative, afebrile, mild leukocytosis consistent with fracture and surgery.  No evidence of infection.  Chronic hypotension --Normotensive.  Stable, asymptomatic  Acute on chronic kidney disease stage IV (suspected).  Creatinine 1.35 in April. --Creatinine 1.44, appears to be at baseline at this point.  Abnormal chest x-ray, chest x-ray raised concern for atelectasis versus pneumonia.  Patient without symptoms, no indication for antibiotics.  Presume atelectasis.  Insomnia --Treated with diazepam as an outpatient  GERD --Continue Protonix and famotidine  Depression --Continue olanzapine  Consultants: Orthopedics  Procedures: Cephalo-medullary nailing of right intertrochanteric femur fracture Intraoperative use of arthroscopy with interpretation of images.  Today's assessment: S: Feels okay this morning except for right leg pain. O: Vitals:  Vitals:   01/10/18 1941 01/11/18 0445  BP: (!) 124/49 (!) 116/100  Pulse: 73 78  Resp: 14 15  Temp: 98.3 F (36.8 C) 98.2 F (36.8 C)  SpO2: 94% 92%    Constitutional:  . Appears calm and comfortable Eyes:  . pupils and irises appear normal ENMT:  . grossly normal hearing  . Lips appear normal Respiratory:  . CTA bilaterally, no w/r/r.  . Respiratory effort normal.  Cardiovascular:  . RRR, no m/r/g . No LE extremity edema   Abdomen:  . Soft, nontender, nondistended Musculoskeletal:  . RLE,  LLE   . Moves both  legs to command Skin:  . Early pressure injury right heel noted, no skin breakdown Neurologic:  . Face appears symmetric Psychiatric:  . Mental status o Mood, affect appropriate o Follows simple commands  Potassium within normal limits, creatinine 1.44, hemoglobin stable at 9.1.  Urinalysis negative.  Discharge Instructions   Allergies as of 01/11/2018      Reactions   Penicillins Other (See Comments)   DID THE REACTION INVOLVE: Swelling of the face/tongue/throat, SOB, or low BP? Unknown Sudden or severe rash/hives, skin peeling, or the inside of the mouth or nose? Unknown Did it require medical treatment? Unknown When did it last happen?very long ago If all above answers are "NO", may proceed with cephalosporin use.   Sulfa Antibiotics    Other reaction(s): Other Been so long not sure anymore      Medication List    TAKE these medications   acetaminophen 325 MG tablet Commonly known as:  TYLENOL Take 650 mg by mouth every 6 (six) hours as needed for mild pain or fever.   aspirin 325 MG tablet Commonly known as:  BAYER ASPIRIN Take 1 tablet (325 mg total) by mouth daily.   bisacodyl 10 MG suppository Commonly known as:  DULCOLAX Place 10 mg rectally as needed (NO bowel movement in 2 days).   diazepam 2 MG tablet Commonly known as:  VALIUM Take 1 tablet (2 mg total) by mouth at bedtime. What changed:  how much to take   docusate sodium 100 MG capsule Commonly known as:  COLACE Take 100 mg by mouth 2 (two) times daily.   famotidine 20 MG tablet Commonly known as:  PEPCID Take 20 mg by mouth at bedtime.   feeding supplement (ENSURE ENLIVE) Liqd Take 120 mLs by mouth as needed (nutrition intake). CHOCOLATE FLAVOR   FIBER CHOICE FRUITY BITES PO Take 2 tablets by mouth daily as needed (constipation).   HYDROcodone-acetaminophen 5-325 MG tablet Commonly known as:  NORCO/VICODIN Take 1 tablet by mouth every 6 (six) hours as needed for up to 7 days for  severe pain (severe pain).   loperamide 2 MG capsule Commonly known as:  IMODIUM Take 2 mg by mouth as needed for diarrhea or loose stools (up to 4 times daily for diarrhe).   OLANZapine 2.5 MG tablet Commonly known as:  ZYPREXA Take 2.5 mg by mouth at bedtime.   pantoprazole 40 MG tablet Commonly known as:  PROTONIX Take 40 mg by mouth every morning.   polyethylene glycol packet Commonly known as:  MIRALAX / GLYCOLAX Take 17 g by mouth daily as needed (NO bowel movement in 1 day before).   senna 8.6 MG Tabs tablet Commonly known as:  SENOKOT Take 1 tablet by mouth daily as needed for mild constipation.   simethicone 80 MG chewable tablet Commonly known as:  MYLICON Chew 80 mg by mouth 3 (three) times daily as needed for flatulence (after meals and bedtime for gas).   THERA Tabs Take 1 tablet by mouth every morning.   trimethoprim 100 MG tablet Commonly known as:  TRIMPEX Take 50 mg by mouth every morning.   Vitamin D3 50 MCG (2000 UT) Tabs Take 2,000 Units by mouth every morning.      Allergies  Allergen Reactions  . Penicillins Other (See Comments)    DID THE REACTION INVOLVE: Swelling of the face/tongue/throat, SOB, or low BP? Unknown Sudden or severe rash/hives, skin peeling, or the inside of the mouth or  nose? Unknown Did it require medical treatment? Unknown When did it last happen?very long ago If all above answers are "NO", may proceed with cephalosporin use.    . Sulfa Antibiotics     Other reaction(s): Other Been so long not sure anymore    The results of significant diagnostics from this hospitalization (including imaging, microbiology, ancillary and laboratory) are listed below for reference.    Significant Diagnostic Studies: Dg Chest 1 View  Result Date: 01/08/2018 CLINICAL DATA:  84 year old nursing home patient fell and sustained a RIGHT femoral neck fracture today. Preoperative evaluation. EXAM: CHEST  1 VIEW COMPARISON:  None.  FINDINGS: Cardiac silhouette normal in size. Thoracic aorta atherosclerotic. Hilar and mediastinal contours otherwise unremarkable. Streaky and patchy airspace opacities involving the LEFT base with silhouetting of the LEFT hemidiaphragm. Minimal linear atelectasis involving the RIGHT lung base. Lungs otherwise clear. Normal pulmonary vascularity. IMPRESSION: 1. Atelectasis and/or bronchopneumonia involving the LEFT lower lobe. 2. Minimal linear atelectasis involving the RIGHT lung base. Electronically Signed   By: Hulan Saas M.D.   On: 01/08/2018 12:43   Dg C-arm 1-60 Min  Result Date: 01/08/2018 CLINICAL DATA:  Status post surgery for fixation of the right femur. EXAM: RIGHT FEMUR 2 VIEWS; DG C-ARM 61-120 MIN COMPARISON:  None. FINDINGS: Four fluoroscopic images demonstrate interval insertion at a right femoral rod through fracture of the intertrochanteric region right femur. No gross malalignment is identified. IMPRESSION: Status post fixation of the right femur is described. Electronically Signed   By: Sherian Rein M.D.   On: 01/08/2018 22:31   Dg Hip Unilat W Or Wo Pelvis 2-3 Views Right  Result Date: 01/08/2018 CLINICAL DATA:  84 year old and recent fall. Right upper thigh pain. Evaluate for fracture. EXAM: DG HIP (WITH OR WITHOUT PELVIS) 2-3V RIGHT COMPARISON:  None. FINDINGS: There is a displaced fracture involving the right femoral intertrochanteric region best seen on the cross-table lateral view. The lesser trochanter is displaced medially and superiorly. The right femoral head is located. Pelvic bony ring is intact. IMPRESSION: Right femoral intertrochanteric fracture. Electronically Signed   By: Richarda Overlie M.D.   On: 01/08/2018 12:46   Dg Femur, Min 2 Views Right  Result Date: 01/08/2018 CLINICAL DATA:  Status post surgery for fixation of the right femur. EXAM: RIGHT FEMUR 2 VIEWS; DG C-ARM 61-120 MIN COMPARISON:  None. FINDINGS: Four fluoroscopic images demonstrate interval  insertion at a right femoral rod through fracture of the intertrochanteric region right femur. No gross malalignment is identified. IMPRESSION: Status post fixation of the right femur is described. Electronically Signed   By: Sherian Rein M.D.   On: 01/08/2018 22:31   Dg Femur, Min 2 Views Right  Result Date: 01/08/2018 CLINICAL DATA:  Right hip fracture EXAM: RIGHT FEMUR 2 VIEWS COMPARISON:  Same day pelvis and right hip radiographs FINDINGS: The proximal right femur are included as part of the pelvis and right hip study. Mid to distal right femur and knee joint are nonacute. Minimal soft tissue calcification overlying the quadriceps tendon may reflect a small phlebolith. Mild degenerative joint space narrowing of the femorotibial compartment. The AP view is slightly oblique limiting further assessment. IMPRESSION: No acute osseous abnormality of the mid to distal right femur. Please refer to the right hip radiographs for an intertrochanteric fracture seen of the right femur. Electronically Signed   By: Tollie Eth M.D.   On: 01/08/2018 14:12   Labs: Basic Metabolic Panel: Recent Labs  Lab 01/08/18 1153 01/09/18 0611 01/10/18 0725  01/11/18 0251  NA 138 138 138 140  K 4.2 4.8 4.5 4.9  CL 111 109 111 113*  CO2 18* 19* 20* 21*  GLUCOSE 164* 183* 103* 105*  BUN 27* 27* 27* 27*  CREATININE 1.63* 1.71* 1.54* 1.44*  CALCIUM 9.2 8.0* 8.1* 8.1*   CBC: Recent Labs  Lab 01/08/18 1153 01/09/18 0611 01/10/18 0725 01/10/18 1831 01/11/18 0251  WBC 12.3* 10.6* 13.5*  --  13.6*  NEUTROABS 9.2*  --   --   --   --   HGB 12.9 8.9* 7.4* 9.4* 9.1*  HCT 40.8 27.0* 22.4* 29.5* 28.7*  MCV 96.0 95.1 95.3  --  94.1  PLT 242 196 202  --  198    Active Problems:   Hip fracture (HCC)   Insomnia   IBS (irritable bowel syndrome)   GERD (gastroesophageal reflux disease)   Depression   Acute blood loss anemia   Essential hypertension   Sleep disturbance   Postoperative pain   Leukocytosis   Time  coordinating discharge: 35 minutes  Signed:  Brendia Sacksaniel Anasia Agro, MD [facility] 01/11/2018, 10:13 AM

## 2018-01-11 NOTE — Progress Notes (Signed)
Called SNF [facility] called report to LPN Victorino DikeJennifer [phone] pt going to bed 310-A. Pt awaiting transport to SNF. Daughter is at bedside

## 2018-02-12 DEATH — deceased

## 2019-07-10 IMAGING — DX DG FEMUR 2+V*R*
2 series · 2 of 2 positions shown · non-contrast
Comparison: Same day pelvis and right hip radiographs

CLINICAL DATA: Right hip fracture

EXAM:
RIGHT FEMUR 2 VIEWS

[femur ap]
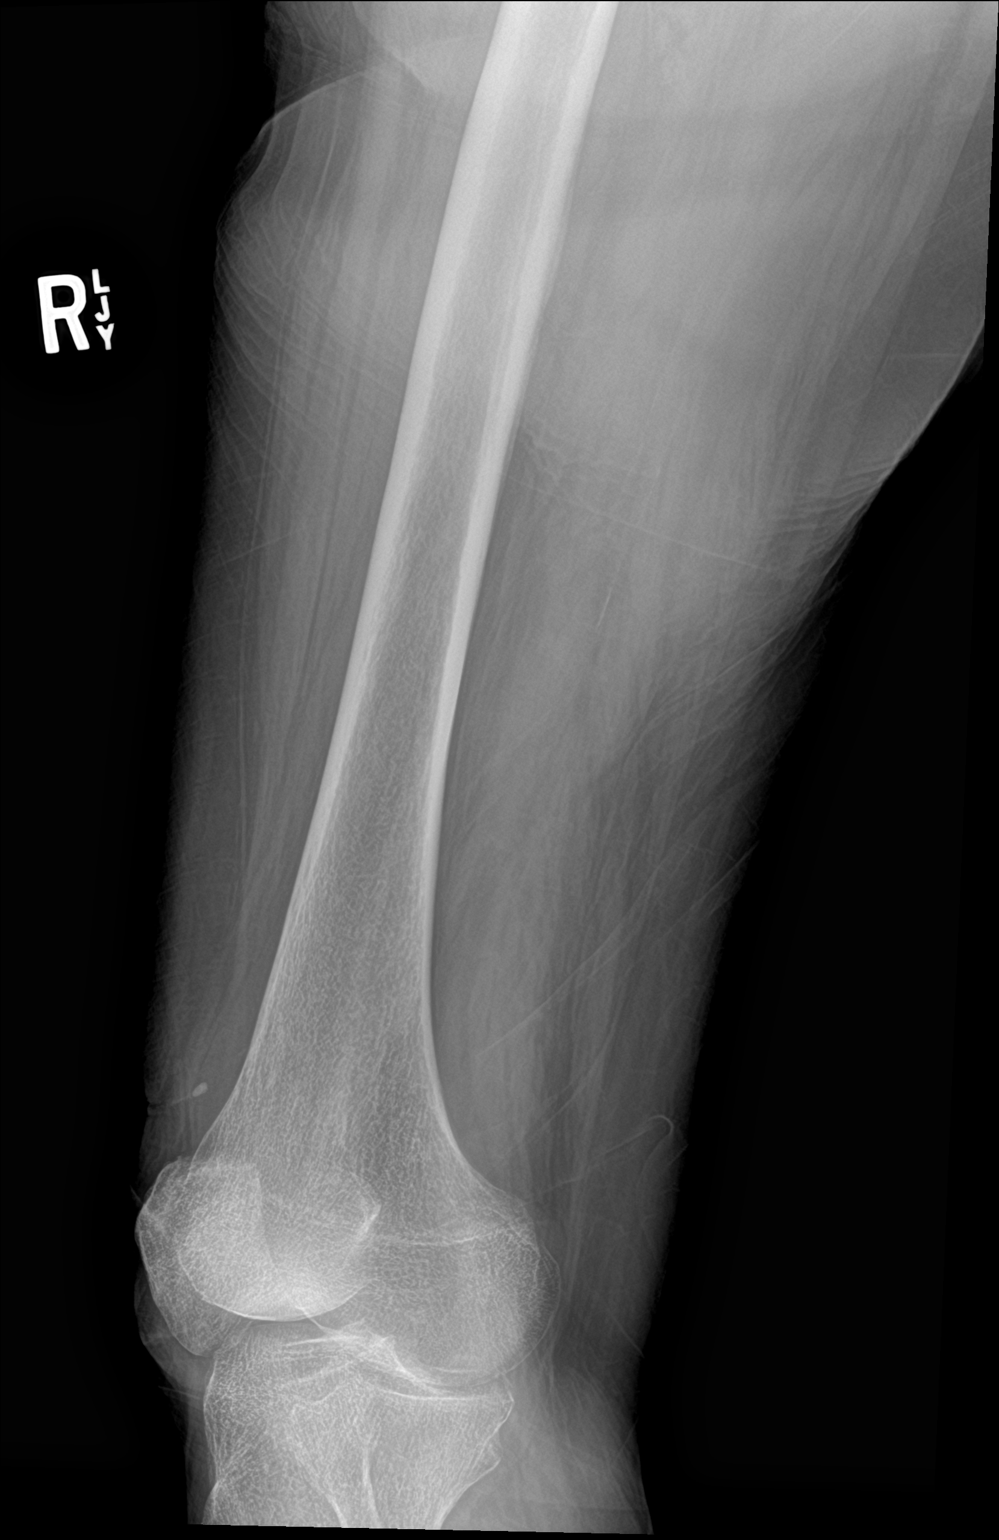

[femur lat]
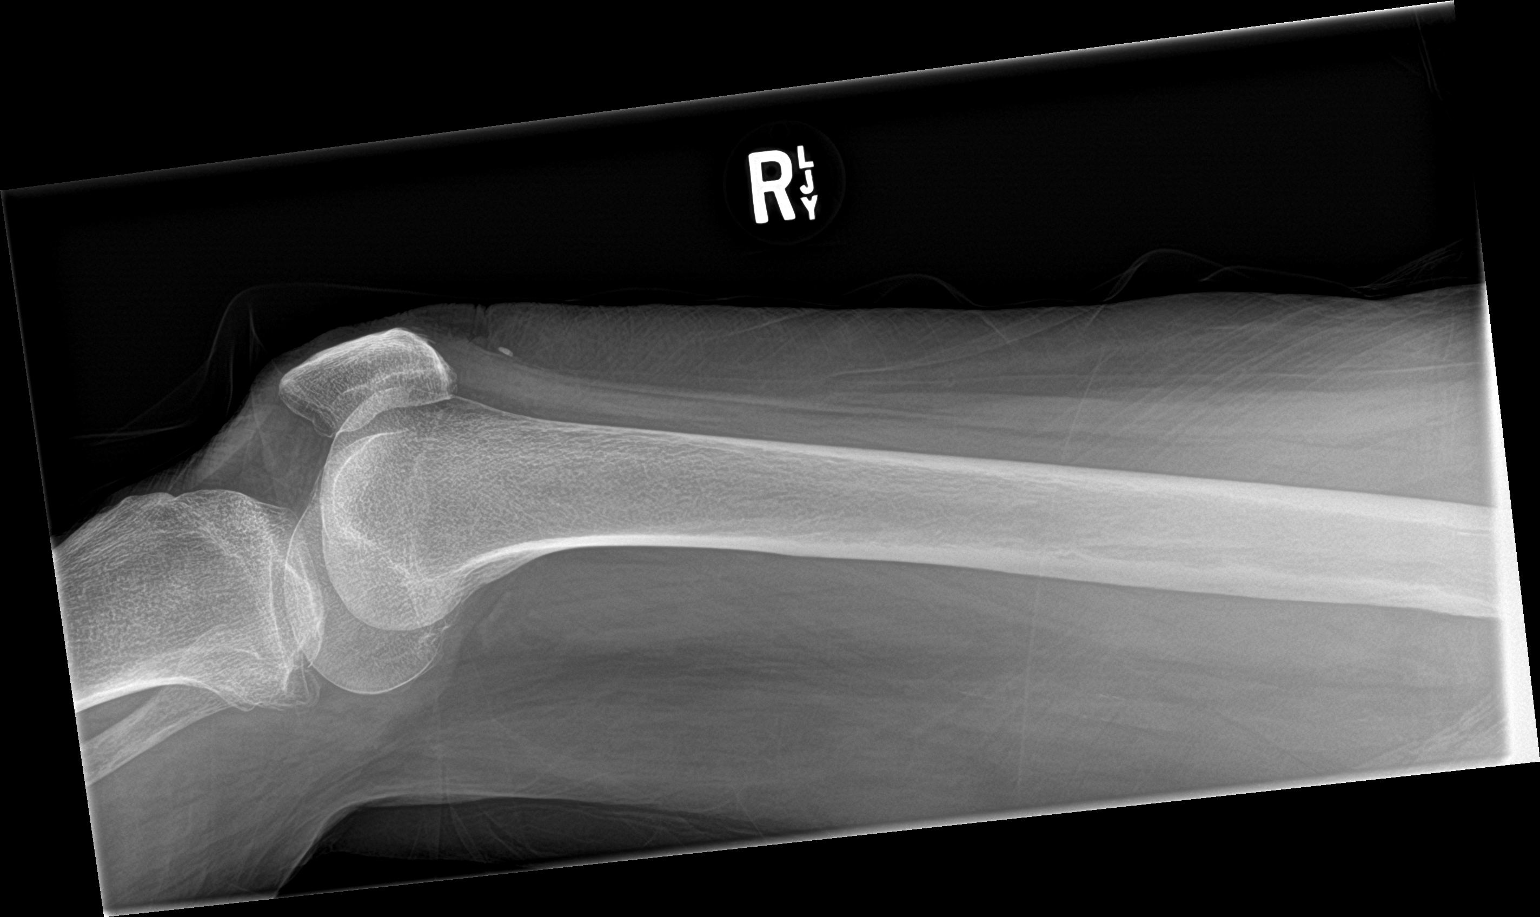

[2 of 2 positions shown; findings below may reference images not displayed]

FINDINGS: The proximal right femur are included as part of the pelvis and
right hip study. Mid to distal right femur and knee joint are
nonacute. Minimal soft tissue calcification overlying the quadriceps
tendon may reflect a small phlebolith. Mild degenerative joint space
narrowing of the femorotibial compartment. The AP view is slightly
oblique limiting further assessment.
IMPRESSION: No acute osseous abnormality of the mid to distal right femur.
Please refer to the right hip radiographs for an intertrochanteric
fracture seen of the right femur.
